# Patient Record
Sex: Female | Born: 1990 | Race: Black or African American | Hispanic: No | Marital: Single | Smoking: Never smoker
Health system: Southern US, Community
[De-identification: ages and names within clinical notes are randomized; demographics above are authoritative.]

## PROBLEM LIST (undated history)

## (undated) DIAGNOSIS — E119 Type 2 diabetes mellitus without complications: Secondary | ICD-10-CM

## (undated) DIAGNOSIS — D649 Anemia, unspecified: Secondary | ICD-10-CM

## (undated) DIAGNOSIS — J45909 Unspecified asthma, uncomplicated: Secondary | ICD-10-CM

## (undated) HISTORY — PX: OTHER SURGICAL HISTORY: SHX169

---
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
## 2011-11-15 ENCOUNTER — Emergency Department (HOSPITAL_COMMUNITY)
Admission: EM | Admit: 2011-11-15 | Discharge: 2011-11-15 | Disposition: A | Discharge status: Home / Self Care | Payer: Self-pay | Attending: Emergency Medicine | Admitting: Emergency Medicine

## 2011-11-15 ENCOUNTER — Encounter (HOSPITAL_COMMUNITY): Payer: Self-pay | Admitting: *Deleted

## 2011-11-15 DIAGNOSIS — R1031 Right lower quadrant pain: Secondary | ICD-10-CM | POA: Insufficient documentation

## 2011-11-15 DIAGNOSIS — R109 Unspecified abdominal pain: Secondary | ICD-10-CM

## 2011-11-15 DIAGNOSIS — R112 Nausea with vomiting, unspecified: Secondary | ICD-10-CM | POA: Insufficient documentation

## 2011-11-15 LAB — URINALYSIS, ROUTINE W REFLEX MICROSCOPIC
Glucose, UA: NEGATIVE mg/dL
Nitrite: NEGATIVE
Specific Gravity, Urine: 1.019 (ref 1.005–1.030)
pH: 8 (ref 5.0–8.0)

## 2011-11-15 LAB — CBC WITH DIFFERENTIAL/PLATELET
Basophils Relative: 0 % (ref 0–1)
HCT: 38.2 % (ref 36.0–46.0)
Hemoglobin: 12.5 g/dL (ref 12.0–15.0)
Lymphocytes Relative: 22 % (ref 12–46)
Lymphs Abs: 2.1 10*3/uL (ref 0.7–4.0)
MCH: 27.1 pg (ref 26.0–34.0)
MCV: 82.7 fL (ref 78.0–100.0)
Platelets: 265 10*3/uL (ref 150–400)
RBC: 4.62 MIL/uL (ref 3.87–5.11)
WBC: 9.5 10*3/uL (ref 4.0–10.5)

## 2011-11-15 LAB — COMPREHENSIVE METABOLIC PANEL
ALT: 8 U/L (ref 0–35)
Albumin: 3.9 g/dL (ref 3.5–5.2)
Alkaline Phosphatase: 55 U/L (ref 39–117)
BUN: 11 mg/dL (ref 6–23)
CO2: 26 mEq/L (ref 19–32)
GFR calc non Af Amer: >90 mL/min (ref >90)
Potassium: 4.1 mEq/L (ref 3.5–5.1)
Sodium: 136 mEq/L (ref 135–145)
Total Protein: 7.5 g/dL (ref 6.0–8.3)

## 2011-11-15 LAB — WET PREP, GENITAL
Clue Cells Wet Prep HPF POC: NONE SEEN
Trich, Wet Prep: NONE SEEN

## 2011-11-15 MED ORDER — ONDANSETRON 4 MG PO TBDP
4.0000 mg | ORAL_TABLET | Freq: Once | ORAL | Status: AC
  Administered 2011-11-15: 4 mg via ORAL
  Filled 2011-11-15: qty 1

## 2011-11-15 MED ORDER — ONDANSETRON HCL 4 MG PO TABS: 4.0000 mg | ORAL_TABLET | Freq: Four times a day (QID) | ORAL | Status: AC | PRN

## 2011-11-15 NOTE — ED Provider Notes (Signed)
History     CSN: [id number]  Arrival date & time 11/15/11  1557   First MD Initiated Contact with Patient 11/15/11 2029      Chief Complaint  Patient presents with  . Abdominal Pain    (Consider location/radiation/quality/duration/timing/severity/associated sxs/prior treatment) Patient is a 21 y.o. female presenting with abdominal pain. The history is provided by the patient.  Abdominal Pain The primary symptoms of the illness include abdominal pain, nausea, vomiting and vaginal discharge. The primary symptoms of the illness do not include fever, fatigue, shortness of breath, diarrhea, dysuria or vaginal bleeding. The current episode started 6 to 12 hours ago. The onset of the illness was sudden. The problem has been rapidly improving.  The abdominal pain is located in the RLQ. The abdominal pain does not radiate. The severity of the abdominal pain is 0/10.  Vomiting occurs 2 to 5 times per day. The emesis contains stomach contents.  The vaginal discharge was first noticed yesterday. Vaginal discharge is a recurrent problem. The amount of discharge is scant. The color of the discharge is clear. The vaginal discharge is not associated with itching, burning or dysuria.   The patient states that she believes she is currently not pregnant. The patient has not had a change in bowel habit. Symptoms associated with the illness do not include chills, constipation, urgency, hematuria, frequency or back pain.    History reviewed. No pertinent past medical history.  History reviewed. No pertinent past surgical history.  No family history on file.  History  Substance Use Topics  . Smoking status: Never Smoker   . Smokeless tobacco: Not on file  . Alcohol Use: No    OB History    Grav Para Term Preterm Abortions TAB SAB Ect Mult Living                  Review of Systems  Constitutional: Negative for fever, chills and fatigue.  Respiratory: Negative for cough and shortness of breath.    Cardiovascular: Negative for chest pain and palpitations.  Gastrointestinal: Positive for nausea, vomiting and abdominal pain. Negative for diarrhea and constipation.  Genitourinary: Positive for vaginal discharge. Negative for dysuria, urgency, frequency, hematuria, vaginal bleeding and menstrual problem.  Musculoskeletal: Negative for back pain.  Skin: Negative for itching and rash.  Neurological: Negative for light-headedness and headaches.  All other systems reviewed and are negative.    Allergies  Review of patient's allergies indicates no known allergies.  Home Medications  No current outpatient prescriptions on file.  BP 112/74  Pulse 73  Temp 98.3 F (36.8 C) (Oral)  Resp 18  SpO2 100%  LMP 10/28/2011  Physical Exam  Nursing note and vitals reviewed. Constitutional: She is oriented to person, place, and time. She appears well-developed and well-nourished.  HENT:  Head: Normocephalic and atraumatic.  Eyes: Pupils are equal, round, and reactive to light.  Cardiovascular: Normal rate, regular rhythm, normal heart sounds and intact distal pulses.   Pulmonary/Chest: Effort normal and breath sounds normal. No respiratory distress.  Abdominal: Soft. Bowel sounds are normal. She exhibits no distension. There is no tenderness.       obese  Genitourinary: Uterus is not tender. Cervix exhibits discharge (scant, clear, mucousy). Cervix exhibits no motion tenderness and no friability. Right adnexum displays no tenderness and no fullness. Left adnexum displays no tenderness and no fullness. No bleeding around the vagina.  Neurological: She is alert and oriented to person, place, and time.  Skin: Skin is warm and  dry.  Psychiatric: She has a normal mood and affect.    ED Course  Procedures (including critical care time)   Labs Reviewed  URINALYSIS, ROUTINE W REFLEX MICROSCOPIC  PREGNANCY, URINE  CBC WITH DIFFERENTIAL  COMPREHENSIVE METABOLIC PANEL  WET PREP, GENITAL    GC/CHLAMYDIA PROBE AMP, GENITAL   No results found.   No diagnosis found.    MDM  Splenic-year-old female who presents today with sudden onset of nausea and vomiting this morning, accompanied by right lower artery pain. She just had several episodes of vomiting, and her nausea significantly improved, though she still has occasional waves of nausea. She has had complete resolution of her pain at this point in time. She denies any change in her bowels, any urinary symptoms, any radiation of her pain or any other pain. She does note a mild amount of clear discharge occasionally when she wipes, which she says she has intermittently. She states she has a past history of a UTI, but her current pain does not feel like the dysuria that she had then, and had a previous STD, but was having more discharge and did not have pain with it. She denies any sick contacts, any bad food exposures. On exam she has no abdominal tenderness, and pelvic exam is unremarkable, she has no adnexal tenderness. Given the patient's normal lab work, and complete resolution of her pain prior to being seen, as well as her nausea after some Zofran, do not feel that she needs imaging at this point in time. Discussed with the patient treatment at home, followup with her regular Dr., and indications worsening symptoms for which she should return. The patient is understanding with this plan.       Theotis Burrow, MD 11/16/11 0040

## 2011-11-15 NOTE — ED Notes (Signed)
rlq pain that started this am with n and v.  lmpjuly 1st

## 2011-11-15 NOTE — ED Provider Notes (Signed)
Complains of right lower quadrant pain onset 1 PM today which has since resolved she is currently asymptomatic and hungry. On exam alert nontoxic abdomen obese soft nontender. Remote possibility of appendicitis discussed with patient. She knows to return or go to another hospital for reevaluation if symptoms recur. Clear liquids for the next 12 hours. Pain felt to be nonspecific presently.  Doug Sou, MD 11/15/11 2206

## 2011-11-16 NOTE — ED Provider Notes (Signed)
I have personally seen and examined the patient.  I have discussed the plan of care with the resident.  I have reviewed the documentation on PMH/FH/Soc. History.  I have reviewed the documentation of the resident and agree.  Doug Sou, MD 11/16/11 0130

## 2014-04-02 ENCOUNTER — Emergency Department (HOSPITAL_COMMUNITY)
Admission: EM | Admit: 2014-04-02 | Discharge: 2014-04-02 | Disposition: A | Discharge status: Home / Self Care | Payer: Medicaid Other | Attending: Emergency Medicine | Admitting: Emergency Medicine

## 2014-04-02 ENCOUNTER — Encounter (HOSPITAL_COMMUNITY): Payer: Self-pay | Admitting: Emergency Medicine

## 2014-04-02 DIAGNOSIS — R112 Nausea with vomiting, unspecified: Secondary | ICD-10-CM

## 2014-04-02 DIAGNOSIS — O2342 Unspecified infection of urinary tract in pregnancy, second trimester: Secondary | ICD-10-CM | POA: Diagnosis not present

## 2014-04-02 DIAGNOSIS — O210 Mild hyperemesis gravidarum: Secondary | ICD-10-CM | POA: Diagnosis present

## 2014-04-02 DIAGNOSIS — Z3A18 18 weeks gestation of pregnancy: Secondary | ICD-10-CM | POA: Insufficient documentation

## 2014-04-02 DIAGNOSIS — N390 Urinary tract infection, site not specified: Secondary | ICD-10-CM

## 2014-04-02 DIAGNOSIS — J45909 Unspecified asthma, uncomplicated: Secondary | ICD-10-CM | POA: Diagnosis not present

## 2014-04-02 HISTORY — DX: Unspecified asthma, uncomplicated: J45.909

## 2014-04-02 LAB — I-STAT CHEM 8, ED
BUN: 7 mg/dL (ref 6–23)
CHLORIDE: 102 meq/L (ref 96–112)
CREATININE: 0.5 mg/dL (ref 0.50–1.10)
Calcium, Ion: 1.17 mmol/L (ref 1.12–1.23)
Glucose, Bld: 112 mg/dL — ABNORMAL HIGH (ref 70–99)
HCT: 33 % — ABNORMAL LOW (ref 36.0–46.0)
Hemoglobin: 11.2 g/dL — ABNORMAL LOW (ref 12.0–15.0)
POTASSIUM: 3.7 meq/L (ref 3.7–5.3)
Sodium: 136 mEq/L — ABNORMAL LOW (ref 137–147)
TCO2: 22 mmol/L (ref 0–100)

## 2014-04-02 LAB — URINE MICROSCOPIC-ADD ON

## 2014-04-02 LAB — URINALYSIS, ROUTINE W REFLEX MICROSCOPIC
Bilirubin Urine: NEGATIVE
GLUCOSE, UA: NEGATIVE mg/dL
Hgb urine dipstick: NEGATIVE
KETONES UR: NEGATIVE mg/dL
Nitrite: NEGATIVE
PH: 7.5 (ref 5.0–8.0)
PROTEIN: NEGATIVE mg/dL
SPECIFIC GRAVITY, URINE: 1.024 (ref 1.005–1.030)
UROBILINOGEN UA: 1 mg/dL (ref 0.0–1.0)

## 2014-04-02 MED ORDER — ONDANSETRON 8 MG PO TBDP: ORAL_TABLET | ORAL | Status: DC

## 2014-04-02 MED ORDER — ONDANSETRON 4 MG PO TBDP
8.0000 mg | ORAL_TABLET | Freq: Once | ORAL | Status: AC
  Administered 2014-04-02: 8 mg via ORAL

## 2014-04-02 MED ORDER — CEPHALEXIN 500 MG PO CAPS: 500.0000 mg | ORAL_CAPSULE | Freq: Four times a day (QID) | ORAL | Status: DC

## 2014-04-02 NOTE — ED Provider Notes (Signed)
CSN: [id number]     Arrival date & time 04/02/14  0229 History  This chart was scribed for Joya Gaskinsonald W Yigit Norkus, MD by Elon SpannerGarrett Cook, ED Scribe. This patient was seen in room A09C/A09C and the patient's care was started at 3:00 AM.   Chief Complaint  Patient presents with  . Emesis During Pregnancy    Patient is a 23 y.o. female presenting with vomiting. The history is provided by the patient. No language interpreter was used.  Emesis Severity:  Mild Duration:  15 hours Timing:  Intermittent Number of daily episodes:  3 total since onset Quality:  Stomach contents Progression:  Unchanged Chronicity:  Recurrent Recent urination:  Normal Relieved by:  Nothing Worsened by:  Nothing tried Ineffective treatments:  None tried Associated symptoms: no abdominal pain, no diarrhea and no fever   Risk factors: pregnant now    HPI Comments: Bethany Bray is a 23 y.o. female who is [redacted] weeks pregnant and due on May 3rd.  She [facility] and takes a prenatal vitamin and asthma medication but no others.  She denies any complications from the pregnancy, but states that she did experience a significant amount of emesis during her first trimester that subsided during her second trimester.  She has an upcoming appointment with her OB/GYN.  She presents to the Emergency Department complaining of nausea and 3 episodes of emesis onset yesterday at noon.  She reports 1 episode had a very small amount of blood content.  She also notes mild abdominal pressure.  She has mild back pain currently that she attributes to her current position on the bed.   Patient denies fever, cough, dysuria, vaginal discharge/bleeding, contractions.  Patient denies CP.    Past Medical History  Diagnosis Date  . Asthma    History reviewed. No pertinent past surgical history. No family history on file. History  Substance Use Topics  . Smoking status: Never Smoker   . Smokeless tobacco: Not  on file  . Alcohol Use: No   OB History    Gravida Para Term Preterm AB TAB SAB Ectopic Multiple Living   1              Review of Systems  Constitutional: Negative for fever.  Respiratory: Negative for cough.   Cardiovascular: Negative for chest pain.  Gastrointestinal: Positive for nausea and vomiting. Negative for abdominal pain and diarrhea.  Genitourinary: Negative for dysuria, vaginal bleeding and vaginal discharge.  All other systems reviewed and are negative.     Allergies  Review of patient's allergies indicates no known allergies.  Home Medications   Prior to Admission medications   Not on File   BP 137/67 mmHg  Pulse 103  Temp(Src) 97.9 F (36.6 C) (Oral)  Resp 18  Ht 5' (1.524 m)  Wt 187 lb (84.823 kg)  BMI 36.52 kg/m2  SpO2 99%  LMP 10/24/2013 (Exact Date) Physical Exam  Nursing note and vitals reviewed.  CONSTITUTIONAL: Well developed/well nourished HEAD: Normocephalic/atraumatic EYES: EOMI/PERRL ENMT: Mucous membranes moist NECK: supple no meningeal signs SPINE/BACK:entire spine nontender CV: S1/S2 noted, no murmurs/rubs/gallops noted LUNGS: Lungs are clear to auscultation bilaterally, no apparent distress ABDOMEN: soft, nontender, no rebound or guarding, bowel sounds noted throughout abdomen GU:no cva tenderness NEURO: Pt is awake/alert/appropriate, moves all extremitiesx4.  No facial droop.   EXTREMITIES: pulses normal/equal, full ROM SKIN: warm, color normal PSYCH: no abnormalities of mood noted, alert and oriented to situation  ED Course  Procedures   DIAGNOSTIC STUDIES: Oxygen Saturation is 99% on RA, normal by my interpretation.    COORDINATION OF CARE:  3:05 AM Discussed treatment plan with patient at bedside.  Patient acknowledges and agrees with plan.    Pt improved She has no focal abdominal tenderness She is taking PO He vitals are appropriate Will treat for UTI  She also requests meds for nausea She has OBGYN followup  early next week [facility] but [facility] reports it has not been documented recently, and she denies abd pain/cramping/bleeding   Labs Review Labs Reviewed  URINALYSIS, ROUTINE W REFLEX MICROSCOPIC - Abnormal; Notable for the following:    APPearance CLOUDY (*)    Leukocytes, UA SMALL (*)    All other components within normal limits  URINE MICROSCOPIC-ADD ON - Abnormal; Notable for the following:    Squamous Epithelial / LPF MANY (*)    Bacteria, UA MANY (*)    All other components within normal limits  I-STAT CHEM 8, ED - Abnormal; Notable for the following:    Sodium 136 (*)    Glucose, Bld 112 (*)    Hemoglobin 11.2 (*)    HCT 33.0 (*)    All other components within normal limits  URINE CULTURE    Medications  ondansetron (ZOFRAN-ODT) disintegrating tablet 8 mg (8 mg Oral Given 04/02/14 0309)    MDM   Final diagnoses:  Non-intractable vomiting with nausea, vomiting of unspecified type  UTI (lower urinary tract infection)    Nursing notes including past medical history and social history reviewed and considered in documentation Labs/vital reviewed myself and considered during evaluation   I personally performed the services described in this documentation, which was scribed in my presence. The recorded information has been reviewed and is accurate.      Joya Gaskinsonald W Mosella Kasa, MD 04/02/14 [phone]

## 2014-04-02 NOTE — ED Notes (Signed)
Pt arrives with c/o nausea/emesis and "pressure" in generalized abdomen. States she's [redacted] weeks pregnant.

## 2014-04-02 NOTE — ED Notes (Signed)
EDP at bedside, unable to hear fetal heart tones with doppler

## 2014-04-03 LAB — URINE CULTURE: Colony Count: >=100000

## 2014-12-12 ENCOUNTER — Emergency Department (HOSPITAL_COMMUNITY)
Admission: EM | Admit: 2014-12-12 | Discharge: 2014-12-12 | Disposition: A | Discharge status: Home / Self Care | Payer: Medicaid Other | Attending: Emergency Medicine | Admitting: Emergency Medicine

## 2014-12-12 ENCOUNTER — Encounter (HOSPITAL_COMMUNITY): Payer: Self-pay

## 2014-12-12 DIAGNOSIS — Z3202 Encounter for pregnancy test, result negative: Secondary | ICD-10-CM | POA: Insufficient documentation

## 2014-12-12 DIAGNOSIS — Z79899 Other long term (current) drug therapy: Secondary | ICD-10-CM | POA: Insufficient documentation

## 2014-12-12 DIAGNOSIS — D649 Anemia, unspecified: Secondary | ICD-10-CM | POA: Insufficient documentation

## 2014-12-12 DIAGNOSIS — R112 Nausea with vomiting, unspecified: Secondary | ICD-10-CM

## 2014-12-12 DIAGNOSIS — J45909 Unspecified asthma, uncomplicated: Secondary | ICD-10-CM | POA: Insufficient documentation

## 2014-12-12 HISTORY — DX: Anemia, unspecified: D64.9

## 2014-12-12 LAB — COMPREHENSIVE METABOLIC PANEL
ALBUMIN: 4.1 g/dL (ref 3.5–5.0)
ALT: 11 U/L — ABNORMAL LOW (ref 14–54)
ANION GAP: 12 (ref 5–15)
AST: 17 U/L (ref 15–41)
Alkaline Phosphatase: 74 U/L (ref 38–126)
BUN: 13 mg/dL (ref 6–20)
CO2: 23 mmol/L (ref 22–32)
Calcium: 9.3 mg/dL (ref 8.9–10.3)
Chloride: 103 mmol/L (ref 101–111)
Creatinine, Ser: 0.74 mg/dL (ref 0.44–1.00)
GFR calc non Af Amer: >60 mL/min (ref >60)
GLUCOSE: 83 mg/dL (ref 65–99)
POTASSIUM: 4 mmol/L (ref 3.5–5.1)
SODIUM: 138 mmol/L (ref 135–145)
Total Bilirubin: 0.7 mg/dL (ref 0.3–1.2)
Total Protein: 7.2 g/dL (ref 6.5–8.1)

## 2014-12-12 LAB — PREGNANCY, URINE: Preg Test, Ur: NEGATIVE

## 2014-12-12 LAB — CBC WITH DIFFERENTIAL/PLATELET
BASOS PCT: 0 % (ref 0–1)
Basophils Absolute: 0 10*3/uL (ref 0.0–0.1)
EOS ABS: 0 10*3/uL (ref 0.0–0.7)
EOS PCT: 0 % (ref 0–5)
HCT: 38.8 % (ref 36.0–46.0)
Hemoglobin: 12.6 g/dL (ref 12.0–15.0)
Lymphocytes Relative: 7 % — ABNORMAL LOW (ref 12–46)
Lymphs Abs: 0.7 10*3/uL (ref 0.7–4.0)
MCH: 25.7 pg — ABNORMAL LOW (ref 26.0–34.0)
MCHC: 32.5 g/dL (ref 30.0–36.0)
MCV: 79.2 fL (ref 78.0–100.0)
MONO ABS: 0.4 10*3/uL (ref 0.1–1.0)
MONOS PCT: 4 % (ref 3–12)
NEUTROS PCT: 89 % — AB (ref 43–77)
Neutro Abs: 8.8 10*3/uL — ABNORMAL HIGH (ref 1.7–7.7)
PLATELETS: 278 10*3/uL (ref 150–400)
RBC: 4.9 MIL/uL (ref 3.87–5.11)
RDW: 13.4 % (ref 11.5–15.5)
WBC: 10 10*3/uL (ref 4.0–10.5)

## 2014-12-12 LAB — URINALYSIS, ROUTINE W REFLEX MICROSCOPIC
Bilirubin Urine: NEGATIVE
GLUCOSE, UA: NEGATIVE mg/dL
Hgb urine dipstick: NEGATIVE
Ketones, ur: NEGATIVE mg/dL
LEUKOCYTES UA: NEGATIVE
NITRITE: NEGATIVE
PROTEIN: NEGATIVE mg/dL
Specific Gravity, Urine: 1.012 (ref 1.005–1.030)
Urobilinogen, UA: 1 mg/dL (ref 0.0–1.0)
pH: 6 (ref 5.0–8.0)

## 2014-12-12 LAB — LIPASE, BLOOD: Lipase: 25 U/L (ref 22–51)

## 2014-12-12 MED ORDER — PROMETHAZINE HCL 25 MG PO TABS: 25.0000 mg | ORAL_TABLET | Freq: Four times a day (QID) | ORAL | Status: DC | PRN

## 2014-12-12 MED ORDER — KETOROLAC TROMETHAMINE 30 MG/ML IJ SOLN
30.0000 mg | Freq: Once | INTRAMUSCULAR | Status: AC
  Administered 2014-12-12: 30 mg via INTRAVENOUS
  Filled 2014-12-12: qty 1

## 2014-12-12 MED ORDER — SODIUM CHLORIDE 0.9 % IV BOLUS (SEPSIS)
1000.0000 mL | Freq: Once | INTRAVENOUS | Status: AC
  Administered 2014-12-12: 1000 mL via INTRAVENOUS

## 2014-12-12 MED ORDER — ONDANSETRON HCL 4 MG/2ML IJ SOLN
4.0000 mg | Freq: Once | INTRAMUSCULAR | Status: AC
  Administered 2014-12-12: 4 mg via INTRAVENOUS
  Filled 2014-12-12: qty 2

## 2014-12-12 NOTE — Discharge Instructions (Signed)
Nausea and Vomiting °Nausea is a sick feeling that often comes before throwing up (vomiting). Vomiting is a reflex where stomach contents come out of your mouth. Vomiting can cause severe loss of body fluids (dehydration). Children and elderly adults can become dehydrated quickly, especially if they also have diarrhea. Nausea and vomiting are symptoms of a condition or disease. It is important to find the cause of your symptoms. °CAUSES  °· Direct irritation of the stomach lining. This irritation can result from increased acid production (gastroesophageal reflux disease), infection, food poisoning, taking certain medicines (such as nonsteroidal anti-inflammatory drugs), alcohol use, or tobacco use. °· Signals from the brain. These signals could be caused by a headache, heat exposure, an inner ear disturbance, increased pressure in the brain from injury, infection, a tumor, or a concussion, pain, emotional stimulus, or metabolic problems. °· An obstruction in the gastrointestinal tract (bowel obstruction). °· Illnesses such as diabetes, hepatitis, gallbladder problems, appendicitis, kidney problems, cancer, sepsis, atypical symptoms of a heart attack, or eating disorders. °· Medical treatments such as chemotherapy and radiation. °· Receiving medicine that makes you sleep (general anesthetic) during surgery. °DIAGNOSIS °Your caregiver may ask for tests to be done if the problems do not improve after a few days. Tests may also be done if symptoms are severe or if the reason for the nausea and vomiting is not clear. Tests may include: °· Urine tests. °· Blood tests. °· Stool tests. °· Cultures (to look for evidence of infection). °· X-rays or other imaging studies. °Test results can help your caregiver make decisions about treatment or the need for additional tests. °TREATMENT °You need to stay well hydrated. Drink frequently but in small amounts. You may wish to drink water, sports drinks, clear broth, or eat frozen  ice pops or gelatin dessert to help stay hydrated. When you eat, eating slowly may help prevent nausea. There are also some antinausea medicines that may help prevent nausea. °HOME CARE INSTRUCTIONS  °· Take all medicine as directed by your caregiver. °· If you do not have an appetite, do not force yourself to eat. However, you must continue to drink fluids. °· If you have an appetite, eat a normal diet unless your caregiver tells you differently. °¨ Eat a variety of complex carbohydrates (rice, wheat, potatoes, bread), lean meats, yogurt, fruits, and vegetables. °¨ Avoid high-fat foods because they are more difficult to digest. °· Drink enough water and fluids to keep your urine clear or pale yellow. °· If you are dehydrated, ask your caregiver for specific rehydration instructions. Signs of dehydration may include: °¨ Severe thirst. °¨ Dry lips and mouth. °¨ Dizziness. °¨ Dark urine. °¨ Decreasing urine frequency and amount. °¨ Confusion. °¨ Rapid breathing or pulse. °SEEK IMMEDIATE MEDICAL CARE IF:  °· You have blood or brown flecks (like coffee grounds) in your vomit. °· You have black or bloody stools. °· You have a severe headache or stiff neck. °· You are confused. °· You have severe abdominal pain. °· You have chest pain or trouble breathing. °· You do not urinate at least once every 8 hours. °· You develop cold or clammy skin. °· You continue to vomit for longer than 24 to 48 hours. °· You have a fever. °MAKE SURE YOU:  °· Understand these instructions. °· Will watch your condition. °· Will get help right away if you are not doing well or get worse. °Document Released: 04/15/2005 Document Revised: 07/08/2011 Document Reviewed: 09/12/2010 °[employer]® Patient Information ©2015 [employer]. This information is not intended   to replace advice given to you by your health care provider. Make sure you discuss any questions you have with your health care provider.   Please obtain all of your results from medical  records or have your doctors office obtain the results - share them with your doctor - you should be seen at your doctors office in the next 2 days. Call today to arrange your follow up. Take the medications as prescribed. Please review all of the medicines and only take them if you do not have an allergy to them. Please be aware that if you are taking birth control pills, taking other prescriptions, ESPECIALLY ANTIBIOTICS may make the birth control ineffective - if this is the case, either do not engage in sexual activity or use alternative methods of birth control such as condoms until you have finished the medicine and your family doctor says it is OK to restart them. If you are on a blood thinner such as COUMADIN, be aware that any other medicine that you take may cause the coumadin to either work too much, or not enough - you should have your coumadin level rechecked in next 7 days if this is the case.  ?  It is also a possibility that you have an allergic reaction to any of the medicines that you have been prescribed - Everybody reacts differently to medications and while MOST people have no trouble with most medicines, you may have a reaction such as nausea, vomiting, rash, swelling, shortness of breath. If this is the case, please stop taking the medicine immediately and contact your physician.  ?  You should return to the ER if you develop severe or worsening symptoms.

## 2014-12-12 NOTE — ED Notes (Signed)
Pt reports she has been nauseous since midnight last night. 2 episodes of vomiting this morning since she got up. Pt also her urine has a very strong odor with back pain.

## 2014-12-12 NOTE — ED Provider Notes (Signed)
CSN: [id number]     Arrival date & time 12/12/14  4098 History   First MD Initiated Contact with Patient 12/12/14 [phone]     Chief Complaint  Patient presents with  . Nausea  . Emesis     (Consider location/radiation/quality/duration/timing/severity/associated sxs/prior Treatment) HPI Comments: 24 year old female with a history of asthma who presents with nausea and vomiting. The patient states that around midnight she began feeling nauseated and she had 2 episodes of vomiting this morning. She states that this morning she noticed that her urine had a strong odor and she has had mild lower back pain. She has mild generalized abdominal discomfort. Normal bowel movement 2 days ago. No diarrhea or problems with constipation. No fevers. No dysuria. No vaginal bleeding or discharge. No new or unusual foods last night. Last menstrual period was at the end of July.  Patient is a 24 y.o. female presenting with vomiting. The history is provided by the patient.  Emesis   Past Medical History  Diagnosis Date  . Asthma   . Anemia    Past Surgical History  Procedure Laterality Date  . Cesarean section     History reviewed. No pertinent family history. Social History  Substance Use Topics  . Smoking status: Never Smoker   . Smokeless tobacco: None  . Alcohol Use: No   OB History    Gravida Para Term Preterm AB TAB SAB Ectopic Multiple Living   1              Review of Systems  Gastrointestinal: Positive for vomiting.    10 Systems reviewed and are negative for acute change except as noted in the HPI.   Allergies  Review of patient's allergies indicates no known allergies.  Home Medications   Prior to Admission medications   Medication Sig Start Date End Date Taking? Authorizing Provider  albuterol (PROAIR HFA) 108 (90 BASE) MCG/ACT inhaler Inhale 1 puff into the lungs every 6 (six) hours as needed for wheezing or shortness of breath.  02/14/14  Yes Historical Provider, MD  IRON  PO Take 1 tablet by mouth daily.    Yes Historical Provider, MD  PRESCRIPTION MEDICATION Take 1 tablet by mouth daily. *Birth control* with one hormone   Yes Historical Provider, MD  cephALEXin (KEFLEX) 500 MG capsule Take 1 capsule (500 mg total) by mouth 4 (four) times daily. Patient not taking: Reported on 12/12/2014 04/02/14   Zadie Rhine, MD  ondansetron [facility] ODT) 8 MG disintegrating tablet 8mg  ODT q4 hours prn nausea Patient not taking: Reported on 12/12/2014 04/02/14   Zadie Rhine, MD   BP 122/70 mmHg  Pulse 83  Temp(Src) 98.3 F (36.8 C) (Oral)  Resp 14  Ht 4\' 10"  (1.473 m)  Wt 178 lb (80.74 kg)  BMI 37.21 kg/m2  SpO2 100%  LMP 11/12/2014 (Exact Date)  Breastfeeding? Unknown Physical Exam  Constitutional: She is oriented to person, place, and time. She appears well-developed and well-nourished. No distress.  HENT:  Head: Normocephalic and atraumatic.  Moist mucous membranes  Eyes: Conjunctivae are normal. Pupils are equal, round, and reactive to light.  Neck: Neck supple.  Cardiovascular: Normal rate, regular rhythm and normal heart sounds.   No murmur heard. Pulmonary/Chest: Effort normal and breath sounds normal.  Abdominal: Soft. Bowel sounds are normal. She exhibits no distension.  Mild suprapubic tenderness to palpation with no rebound or guarding  Musculoskeletal: She exhibits no edema.  Neurological: She is alert and oriented to person, place, and time.  Fluent speech  Skin: Skin is warm and dry.  Psychiatric: She has a normal mood and affect. Judgment normal.  Nursing note and vitals reviewed.   ED Course  Procedures (including critical care time) Labs Review Labs Reviewed  CBC WITH DIFFERENTIAL/PLATELET - Abnormal; Notable for the following:    MCH 25.7 (*)    Neutrophils Relative % 89 (*)    Neutro Abs 8.8 (*)    Lymphocytes Relative 7 (*)    All other components within normal limits  COMPREHENSIVE METABOLIC PANEL - Abnormal; Notable for the  following:    ALT 11 (*)    All other components within normal limits  URINE CULTURE  PREGNANCY, URINE  URINALYSIS, ROUTINE W REFLEX MICROSCOPIC (NOT AT [facility])  LIPASE, BLOOD    Imaging Review No results found. I, Ambrose Finland Little, personally reviewed and evaluated these lab results as part of my medical decision-making.  Medications  ondansetron (ZOFRAN) injection 4 mg (4 mg Intravenous Given 12/12/14 1118)  sodium chloride 0.9 % bolus 1,000 mL (0 mLs Intravenous Stopped 12/12/14 1325)  ketorolac (TORADOL) 30 MG/ML injection 30 mg (30 mg Intravenous Given 12/12/14 1406)    MDM   Final diagnoses:  None   24 year old female who presents with nausea that began last night as well as a few episodes of vomiting and low back pain. Patient well-appearing with normal vital signs at presentation. Mild suprapubic tenderness without right lower quadrant tenderness on exam. Obtained above lab work and gave the patient IV fluid bolus as well as Zofran.  Labs including UA unremarkable. After receiving medications, the patient reported an improvement in her nausea and was able to drink water. No episodes of vomiting in the emergency department. On reexamination, the patient had no lower abdominal tenderness but did have mild generalized tenderness of abdomen in the bilateral upper quadrants and peri- Umbilical area is without rebound, guarding, or peritonitis. The patient's labs are reassuring against pyelonephritis or kidney stone and she denies any vaginal discharge to suggest cervicitis/PID. No lower abdominal tenderness on reexamination thus I feel that appendicitis is unlikely, and normal white count is reassuring. The patient complains of back pain which feels muscular in nature and is worse with certain movements. Gave the patient Toradol and instructed on supportive care at home for muscular back pain. I extensively reviewed return precautions including focal lower abdominal pain that is  worsening, fever, intractable vomiting, or any other new or alarming symptoms. The patient voiced understanding of these return precautions. Provided with prescription for antiemetics at home and patient discharged in satisfactory condition.  Laurence Spates, MD 12/12/14 [phone]

## 2014-12-14 LAB — URINE CULTURE: SPECIAL REQUESTS: NORMAL

## 2014-12-15 ENCOUNTER — Telehealth (HOSPITAL_BASED_OUTPATIENT_CLINIC_OR_DEPARTMENT_OTHER): Payer: Self-pay | Admitting: Emergency Medicine

## 2014-12-15 NOTE — Progress Notes (Signed)
ED Antimicrobial Stewardship Positive Culture Follow Up   Bethany Bray is an 24 y.o. female who presented to [facility] on 12/12/2014 with a chief complaint of  Chief Complaint  Patient presents with  . Nausea  . Emesis    Recent Results (from the past 720 hour(s))  Urine culture     Status: None   Collection Time: 12/12/14 10:53 AM  Result Value Ref Range Status   Specimen Description URINE, RANDOM  Final   Special Requests Normal  Final   Culture >=100,000 COLONIES/mL ESCHERICHIA COLI  Final   Report Status 12/14/2014 FINAL  Final   Organism ID, Bacteria ESCHERICHIA COLI  Final      Susceptibility   Escherichia coli - MIC*    AMPICILLIN 8 SENSITIVE Sensitive     CEFAZOLIN <=4 SENSITIVE Sensitive     CEFTRIAXONE <=1 SENSITIVE Sensitive     CIPROFLOXACIN <=0.25 SENSITIVE Sensitive     GENTAMICIN <=1 SENSITIVE Sensitive     IMIPENEM <=0.25 SENSITIVE Sensitive     NITROFURANTOIN <=16 SENSITIVE Sensitive     TRIMETH/SULFA <=20 SENSITIVE Sensitive     AMPICILLIN/SULBACTAM 4 SENSITIVE Sensitive     PIP/TAZO <=4 SENSITIVE Sensitive     * >=100,000 COLONIES/mL ESCHERICHIA COLI    Patient discharged originally without antimicrobial agent and treatment is now indicated  New antibiotic prescription: Cefazolin 500 mg, 1 tablet by mouth BID for 5 days.  ED Provider: Trixie Dredge, PA-C  Ancil Boozer 12/15/2014, 8:54 AM Infectious Diseases Pharmacist Phone# [phone]

## 2014-12-17 ENCOUNTER — Telehealth (HOSPITAL_BASED_OUTPATIENT_CLINIC_OR_DEPARTMENT_OTHER): Payer: Self-pay | Admitting: Emergency Medicine

## 2014-12-19 ENCOUNTER — Telehealth (HOSPITAL_COMMUNITY): Payer: Self-pay

## 2014-12-19 NOTE — Telephone Encounter (Signed)
Unable to reach by telephone. Letter sent to address on record.  

## 2014-12-28 ENCOUNTER — Telehealth (HOSPITAL_BASED_OUTPATIENT_CLINIC_OR_DEPARTMENT_OTHER): Payer: Self-pay | Admitting: Emergency Medicine

## 2014-12-28 NOTE — Telephone Encounter (Signed)
Letter returned, unable to forward, lost to followup

## 2015-01-05 ENCOUNTER — Telehealth (HOSPITAL_COMMUNITY): Payer: Self-pay

## 2015-01-05 NOTE — Telephone Encounter (Signed)
Unable to contact pt by mail or telephone. Unable to communicate lab results or treatment changes. 

## 2015-02-21 ENCOUNTER — Encounter (HOSPITAL_COMMUNITY): Payer: Self-pay | Admitting: Emergency Medicine

## 2015-02-21 ENCOUNTER — Emergency Department (HOSPITAL_COMMUNITY)
Admission: EM | Admit: 2015-02-21 | Discharge: 2015-02-21 | Disposition: A | Discharge status: Home / Self Care | Payer: Self-pay | Attending: Emergency Medicine | Admitting: Emergency Medicine

## 2015-02-21 ENCOUNTER — Emergency Department (HOSPITAL_COMMUNITY): Payer: Medicaid Other

## 2015-02-21 DIAGNOSIS — J45901 Unspecified asthma with (acute) exacerbation: Secondary | ICD-10-CM | POA: Insufficient documentation

## 2015-02-21 DIAGNOSIS — J069 Acute upper respiratory infection, unspecified: Secondary | ICD-10-CM | POA: Insufficient documentation

## 2015-02-21 DIAGNOSIS — F419 Anxiety disorder, unspecified: Secondary | ICD-10-CM | POA: Insufficient documentation

## 2015-02-21 DIAGNOSIS — R079 Chest pain, unspecified: Secondary | ICD-10-CM | POA: Insufficient documentation

## 2015-02-21 DIAGNOSIS — Z862 Personal history of diseases of the blood and blood-forming organs and certain disorders involving the immune mechanism: Secondary | ICD-10-CM | POA: Insufficient documentation

## 2015-02-21 DIAGNOSIS — Z79899 Other long term (current) drug therapy: Secondary | ICD-10-CM | POA: Insufficient documentation

## 2015-02-21 DIAGNOSIS — M549 Dorsalgia, unspecified: Secondary | ICD-10-CM | POA: Insufficient documentation

## 2015-02-21 MED ORDER — PREDNISONE 20 MG PO TABS
50.0000 mg | ORAL_TABLET | Freq: Once | ORAL | Status: AC
  Administered 2015-02-21: 50 mg via ORAL
  Filled 2015-02-21: qty 3

## 2015-02-21 MED ORDER — IPRATROPIUM-ALBUTEROL 0.5-2.5 (3) MG/3ML IN SOLN
3.0000 mL | Freq: Once | RESPIRATORY_TRACT | Status: AC
  Administered 2015-02-21: 3 mL via RESPIRATORY_TRACT
  Filled 2015-02-21: qty 3

## 2015-02-21 MED ORDER — ALBUTEROL SULFATE HFA 108 (90 BASE) MCG/ACT IN AERS
1.0000 | INHALATION_SPRAY | RESPIRATORY_TRACT | Status: DC | PRN
  Administered 2015-02-21: 1 via RESPIRATORY_TRACT
  Filled 2015-02-21: qty 6.7

## 2015-02-21 MED ORDER — PREDNISONE 50 MG PO TABS: ORAL_TABLET | ORAL | Status: DC

## 2015-02-21 MED ORDER — AEROCHAMBER PLUS FLO-VU SMALL MISC
1.0000 | Freq: Once | Status: AC
  Administered 2015-02-21: 1
  Filled 2015-02-21 (×2): qty 1

## 2015-02-21 NOTE — ED Notes (Signed)
PT returned from scans. Monitored by pulse ox, bp cuff, and 5-lead. 

## 2015-02-21 NOTE — ED Notes (Signed)
Pt is in stable condition upon d/c an ambulates from ED. 

## 2015-02-21 NOTE — ED Provider Notes (Signed)
CSN: [id number]     Arrival date & time 02/21/15  6578 History   First MD Initiated Contact with Patient 02/21/15 1002     Chief Complaint  Patient presents with  . Asthma   HPI  Bethany Bray is a 24 year old female with PMHx of asthma presenting with SOB. Pt states that she has had a productive cough and sore throat for 1-2 weeks. Her cough is productive of yellow sputum without blood. She states that her throat is mostly sore after coughing and feels "dry". Denies painful swallowing, difficulty eating/drinking or neck pain. She states that last evening she became short of breath while resting on the couch. She has had issues with her insurance provider and was unable to fill her rescue inhaler. She attempted to "sleep it off" but was still experiencing SOB when she woke. She feels that she cannot get a big breath in. She denies any exacerbating factors of her SOB. She states that she is having chest and back pain when she attempts to take deep breaths in. She has not had an asthma exacerbation in over a year. Denies fevers, chills, headache, dizziness, lightheadedness, syncope, congestion, rhinorrhea, ear pain, abdominal pain, nausea or vomiting.   Past Medical History  Diagnosis Date  . Asthma   . Anemia    Past Surgical History  Procedure Laterality Date  . Cesarean section     No family history on file. Social History  Substance Use Topics  . Smoking status: Never Smoker   . Smokeless tobacco: None  . Alcohol Use: No   OB History    Gravida Para Term Preterm AB TAB SAB Ectopic Multiple Living   1              Review of Systems  Constitutional: Negative for fever and chills.  HENT: Positive for sore throat. Negative for congestion, ear pain, rhinorrhea, trouble swallowing and voice change.   Eyes: Negative for pain and itching.  Respiratory: Positive for cough, chest tightness and shortness of breath.   Cardiovascular: Positive for chest pain. Negative for palpitations.   Gastrointestinal: Negative for nausea, vomiting and abdominal pain.  Musculoskeletal: Positive for back pain. Negative for neck pain.  Neurological: Negative for dizziness, syncope, light-headedness and headaches.  All other systems reviewed and are negative.     Allergies  Review of patient's allergies indicates no known allergies.  Home Medications   Prior to Admission medications   Medication Sig Start Date End Date Taking? Authorizing Provider  albuterol (PROAIR HFA) 108 (90 BASE) MCG/ACT inhaler Inhale 1 puff into the lungs every 6 (six) hours as needed for wheezing or shortness of breath.  02/14/14  Yes Historical Provider, MD  predniSONE (DELTASONE) 50 MG tablet Take 1 tablet a day for 5 days 02/21/15   Brenna Friesenhahn, PA-C   BP 109/67 mmHg  Pulse 85  Temp(Src) 98.8 F (37.1 C) (Oral)  Resp 21  Ht 5' (1.524 m)  Wt 180 lb (81.647 kg)  BMI 35.15 kg/m2  SpO2 98%  LMP 02/07/2015 Physical Exam  Constitutional: She appears well-developed and well-nourished. No distress.  HENT:  Head: Normocephalic and atraumatic.  Mouth/Throat: Oropharynx is clear and moist. No oropharyngeal exudate.  Eyes: Conjunctivae are normal. Right eye exhibits no discharge. Left eye exhibits no discharge. No scleral icterus.  Neck: Normal range of motion.  Cardiovascular: Normal rate, regular rhythm, normal heart sounds and intact distal pulses.   Pulmonary/Chest: Tachypnea noted. She is in respiratory distress (increased work  of breathing, cannot speak in full sentences). She has no decreased breath sounds. She has no wheezes. She has no rhonchi. She has no rales.  Musculoskeletal: Normal range of motion.  Moves all extremities spontaneously.   Neurological: She is alert. Coordination normal.  Skin: Skin is warm and dry.  Psychiatric: Her behavior is normal. Her mood appears anxious.  Nursing note and vitals reviewed.   ED Course  Procedures (including critical care time) Labs Review Labs  Reviewed - No data to display  Imaging Review Dg Chest 2 View  02/21/2015  CLINICAL DATA:  Asthma attack, chest tightness, shortness of breath. Productive cough for 2 weeks. EXAM: CHEST  2 VIEW COMPARISON:  06/28/2010. FINDINGS: Trachea is midline. Heart size normal. Lungs are clear. No pleural fluid. IMPRESSION: No acute findings. Electronically Signed   By: Leanna BattlesMelinda  Blietz M.D.   On: 02/21/2015 11:06   I have personally reviewed and evaluated these images and lab results as part of my medical decision-making.   EKG Interpretation None      MDM   Final diagnoses:  Asthma exacerbation  URI, acute   10:15 - Pt with PMHx of asthma presenting with SOB x 1 day. Pt also describing URI symptoms x 1 week. Symptoms include productive cough and sore throat. She has not used a rescue inhaler. Pt is tachypneic and tachycardic on presentation. She appears anxious and has increased work of breathing. She is unable to speak in complete sentences. On exam, pt is moving air well through all lung fields and has no wheezes. Will treat with duoneb and 50 mg prednisone then reassess. 11:15 - Pt HR down to 99 and respirations 12. Pt has unlabored breathing and is able to speak in full sentences. Continues to have good air movement without wheezing in all lung fields. Pt states she feels markedly improved but is still a little short of breath. Will give 2nd duoneb. CXR with no acute findings 12:00 - Pt reports continued improvement. Ambulated with pulse ox and O2 remained 98-100%. Lungs remain clear without wheezing. No respiratory distress. Will give pt albuterol inhaler and spacer for home use. D/c'ed with prednisone burst and instruction to follow up with her PCP in the next few days. Return precautions given in discharge paperwork and discussed with pt at bedside. Pt stable for discharge       Bethany HeimlichStevi Monasia Lair, PA-C 02/21/15 1329  Mancel BaleElliott Wentz, MD 02/24/15 [phone]

## 2015-02-21 NOTE — ED Notes (Signed)
Okay'd with charge to move to POD E

## 2015-02-21 NOTE — Discharge Instructions (Signed)
- Use your inhaler with the spacer as needed for wheezing and shortness of breath - Schedule a follow up appointment with your PCP in the next few days - Return to ED with worsening symptoms   Asthma, Adult Asthma is a recurring condition in which the airways tighten and narrow. Asthma can make it difficult to breathe. It can cause coughing, wheezing, and shortness of breath. Asthma episodes, also called asthma attacks, range from minor to life-threatening. Asthma cannot be cured, but medicines and lifestyle changes can help control it. CAUSES Asthma is believed to be caused by inherited (genetic) and environmental factors, but its exact cause is unknown. Asthma may be triggered by allergens, lung infections, or irritants in the air. Asthma triggers are different for each person. Common triggers include:   Animal dander.  Dust mites.  Cockroaches.  Pollen from trees or grass.  Mold.  Smoke.  Air pollutants such as dust, household cleaners, hair sprays, aerosol sprays, paint fumes, strong chemicals, or strong odors.  Cold air, weather changes, and winds (which increase molds and pollens in the air).  Strong emotional expressions such as crying or laughing hard.  Stress.  Certain medicines (such as aspirin) or types of drugs (such as beta-blockers).  Sulfites in foods and drinks. Foods and drinks that may contain sulfites include dried fruit, potato chips, and sparkling grape juice.  Infections or inflammatory conditions such as the flu, a cold, or an inflammation of the nasal membranes (rhinitis).  Gastroesophageal reflux disease (GERD).  Exercise or strenuous activity. SYMPTOMS Symptoms may occur immediately after asthma is triggered or many hours later. Symptoms include:  Wheezing.  Excessive nighttime or early morning coughing.  Frequent or severe coughing with a common cold.  Chest tightness.  Shortness of breath. DIAGNOSIS  The diagnosis of asthma is made by a  review of your medical history and a physical exam. Tests may also be performed. These may include:  Lung function studies. These tests show how much air you breathe in and out.  Allergy tests.  Imaging tests such as X-rays. TREATMENT  Asthma cannot be cured, but it can usually be controlled. Treatment involves identifying and avoiding your asthma triggers. It also involves medicines. There are 2 classes of medicine used for asthma treatment:   Controller medicines. These prevent asthma symptoms from occurring. They are usually taken every day.  Reliever or rescue medicines. These quickly relieve asthma symptoms. They are used as needed and provide short-term relief. Your health care provider will help you create an asthma action plan. An asthma action plan is a written plan for managing and treating your asthma attacks. It includes a list of your asthma triggers and how they may be avoided. It also includes information on when medicines should be taken and when their dosage should be changed. An action plan may also involve the use of a device called a peak flow meter. A peak flow meter measures how well the lungs are working. It helps you monitor your condition. HOME CARE INSTRUCTIONS   Take medicines only as directed by your health care provider. Speak with your health care provider if you have questions about how or when to take the medicines.  Use a peak flow meter as directed by your health care provider. Record and keep track of readings.  Understand and use the action plan to help minimize or stop an asthma attack without needing to seek medical care.  Control your home environment in the following ways to help prevent  asthma attacks:  Do not smoke. Avoid being exposed to secondhand smoke.  Change your heating and air conditioning filter regularly.  Limit your use of fireplaces and wood stoves.  Get rid of pests (such as roaches and mice) and their droppings.  Throw away  plants if you see mold on them.  Clean your floors and dust regularly. Use unscented cleaning products.  Try to have someone else vacuum for you regularly. Stay out of rooms while they are being vacuumed and for a short while afterward. If you vacuum, use a dust mask from a hardware store, a double-layered or microfilter vacuum cleaner bag, or a vacuum cleaner with a HEPA filter.  Replace carpet with wood, tile, or vinyl flooring. Carpet can trap dander and dust.  Use allergy-proof pillows, mattress covers, and box spring covers.  Wash bed sheets and blankets every week in hot water and dry them in a dryer.  Use blankets that are made of polyester or cotton.  Clean bathrooms and kitchens with bleach. If possible, have someone repaint the walls in these rooms with mold-resistant paint. Keep out of the rooms that are being cleaned and painted.  Wash hands frequently. SEEK MEDICAL CARE IF:   You have wheezing, shortness of breath, or a cough even if taking medicine to prevent attacks.  The colored mucus you cough up (sputum) is thicker than usual.  Your sputum changes from clear or white to yellow, green, gray, or bloody.  You have any problems that may be related to the medicines you are taking (such as a rash, itching, swelling, or trouble breathing).  You are using a reliever medicine more than 2-3 times per week.  Your peak flow is still at 50-79% of your personal best after following your action plan for 1 hour.  You have a fever. SEEK IMMEDIATE MEDICAL CARE IF:   You seem to be getting worse and are unresponsive to treatment during an asthma attack.  You are short of breath even at rest.  You get short of breath when doing very little physical activity.  You have difficulty eating, drinking, or talking due to asthma symptoms.  You develop chest pain.  You develop a fast heartbeat.  You have a bluish color to your lips or fingernails.  You are light-headed, dizzy, or  faint.  Your peak flow is less than 50% of your personal best.   This information is not intended to replace advice given to you by your health care provider. Make sure you discuss any questions you have with your health care provider.   Document Released: 04/15/2005 Document Revised: 01/04/2015 Document Reviewed: 11/12/2012 Elsevier Interactive Patient Education 2016 [employer] Inc[employer]  Asthma Attack Prevention While you may not be able to control the fact that you have asthma, you can take actions to prevent asthma attacks. The best way to prevent asthma attacks is to maintain good control of your asthma. You can achieve this by:  Taking your medicines as directed.  Avoiding things that can irritate your airways or make your asthma symptoms worse (asthma triggers).  Keeping track of how well your asthma is controlled and of any changes in your symptoms.  Responding quickly to worsening asthma symptoms (asthma attack).  Seeking emergency care when it is needed. WHAT ARE SOME WAYS TO PREVENT AN ASTHMA ATTACK? Have a Plan Work with your health care provider to create a written plan for managing and treating your asthma attacks (asthma action plan). This plan includes:  A list  of your asthma triggers and how you can avoid them.  Information on when medicines should be taken and when their dosages should be changed.  The use of a device that measures how well your lungs are working (peak flow meter). Monitor Your Asthma Use your peak flow meter and record your results in a journal every day. A drop in your peak flow numbers on one or more days may indicate the start of an asthma attack. This can happen even before you start to feel symptoms. You can prevent an asthma attack from getting worse by following the steps in your asthma action plan. Avoid Asthma Triggers Work with your asthma health care provider to find out what your asthma triggers are. This can be done by:  Allergy  testing.  Keeping a journal that notes when asthma attacks occur and the factors that may have contributed to them.  Determining if there are other medical conditions that are making your asthma worse. Once you have determined your asthma triggers, take steps to avoid them. This may include avoiding excessive or prolonged exposure to:  Dust. Have someone dust and vacuum your home for you once or twice a week. Using a high-efficiency particulate arrestance (HEPA) vacuum is best.  Smoke. This includes campfire smoke, forest fire smoke, and secondhand smoke from tobacco products.  Pet dander. Avoid contact with animals that you know you are allergic to.  Allergens from trees, grasses or pollens. Avoid spending a lot of time outdoors when pollen counts are high, and on very windy days.  Very cold, dry, or humid air.  Mold.  Foods that contain high amounts of sulfites.  Strong odors.  Outdoor air pollutants, such as Museum/gallery exhibitions officer.  Indoor air pollutants, such as aerosol sprays and fumes from household cleaners.  Household pests, including dust mites and cockroaches, and pest droppings.  Certain medicines, including NSAIDs. Always talk to your health care provider before stopping or starting any new medicines. Medicines Take over-the-counter and prescription medicines only as told by your health care provider. Many asthma attacks can be prevented by carefully following your medicine schedule. Taking your medicines correctly is especially important when you cannot avoid certain asthma triggers. Act Quickly If an asthma attack does happen, acting quickly can decrease how severe it is and how long it lasts. Take these steps:   Pay attention to your symptoms. If you are coughing, wheezing, or having difficulty breathing, do not wait to see if your symptoms go away on their own. Follow your asthma action plan.  If you have followed your asthma action plan and your symptoms are not  improving, call your health care provider or seek immediate medical care at the nearest hospital. It is important to note how often you need to use your fast-acting rescue inhaler. If you are using your rescue inhaler more often, it may mean that your asthma is not under control. Adjusting your asthma treatment plan may help you to prevent future asthma attacks and help you to gain better control of your condition. HOW CAN I PREVENT AN ASTHMA ATTACK WHEN I EXERCISE? Follow advice from your health care provider about whether you should use your fast-acting inhaler before exercising. Many people with asthma experience exercise-induced bronchoconstriction (EIB). This condition often worsens during vigorous exercise in cold, humid, or dry environments. Usually, people with EIB can stay very active by pre-treating with a fast-acting inhaler before exercising.   This information is not intended to replace advice given to you by  your health care provider. Make sure you discuss any questions you have with your health care provider.   Document Released: 04/03/2009 Document Revised: 01/04/2015 Document Reviewed: 09/15/2014 Elsevier Interactive Patient Education [employer]

## 2015-02-21 NOTE — ED Notes (Signed)
Patient states URI for several days.   Patient states history of asthma, but very hard to breathe at this time.   Patient cannot speak in complete sentences.

## 2015-02-21 NOTE — ED Notes (Signed)
Patient transported to X-ray 

## 2015-02-21 NOTE — ED Notes (Addendum)
Pt ambulates well and SpO2 remains between 98% and 100% on RA.

## 2015-07-01 ENCOUNTER — Emergency Department (HOSPITAL_COMMUNITY)
Admission: EM | Admit: 2015-07-01 | Discharge: 2015-07-01 | Disposition: A | Discharge status: Home / Self Care | Payer: Medicaid Other | Attending: Emergency Medicine | Admitting: Emergency Medicine

## 2015-07-01 ENCOUNTER — Encounter (HOSPITAL_COMMUNITY): Payer: Self-pay | Admitting: Family Medicine

## 2015-07-01 DIAGNOSIS — J45909 Unspecified asthma, uncomplicated: Secondary | ICD-10-CM | POA: Insufficient documentation

## 2015-07-01 DIAGNOSIS — O99511 Diseases of the respiratory system complicating pregnancy, first trimester: Secondary | ICD-10-CM | POA: Insufficient documentation

## 2015-07-01 DIAGNOSIS — O210 Mild hyperemesis gravidarum: Secondary | ICD-10-CM | POA: Insufficient documentation

## 2015-07-01 DIAGNOSIS — Z3A01 Less than 8 weeks gestation of pregnancy: Secondary | ICD-10-CM | POA: Insufficient documentation

## 2015-07-01 LAB — CBC
HCT: 38 % (ref 36.0–46.0)
Hemoglobin: 12 g/dL (ref 12.0–15.0)
MCH: 25.3 pg — AB (ref 26.0–34.0)
MCHC: 31.6 g/dL (ref 30.0–36.0)
MCV: 80 fL (ref 78.0–100.0)
PLATELETS: 277 10*3/uL (ref 150–400)
RBC: 4.75 MIL/uL (ref 3.87–5.11)
RDW: 13.6 % (ref 11.5–15.5)
WBC: 12.7 10*3/uL — ABNORMAL HIGH (ref 4.0–10.5)

## 2015-07-01 LAB — COMPREHENSIVE METABOLIC PANEL
ALBUMIN: 3.8 g/dL (ref 3.5–5.0)
ALK PHOS: 50 U/L (ref 38–126)
ALT: 12 U/L — AB (ref 14–54)
AST: 15 U/L (ref 15–41)
Anion gap: 7 (ref 5–15)
BUN: 11 mg/dL (ref 6–20)
CALCIUM: 9.3 mg/dL (ref 8.9–10.3)
CHLORIDE: 105 mmol/L (ref 101–111)
CO2: 25 mmol/L (ref 22–32)
CREATININE: 0.6 mg/dL (ref 0.44–1.00)
GFR calc Af Amer: >60 mL/min (ref >60)
GFR calc non Af Amer: >60 mL/min (ref >60)
GLUCOSE: 82 mg/dL (ref 65–99)
Potassium: 4.6 mmol/L (ref 3.5–5.1)
SODIUM: 137 mmol/L (ref 135–145)
Total Bilirubin: 0.4 mg/dL (ref 0.3–1.2)
Total Protein: 7.7 g/dL (ref 6.5–8.1)

## 2015-07-01 LAB — LIPASE, BLOOD: LIPASE: 23 U/L (ref 11–51)

## 2015-07-01 LAB — HCG, QUANTITATIVE, PREGNANCY: hCG, Beta Chain, Quant, S: 135782 m[IU]/mL — ABNORMAL HIGH (ref <5)

## 2015-07-01 MED ORDER — ONDANSETRON 4 MG PO TBDP
4.0000 mg | ORAL_TABLET | Freq: Once | ORAL | Status: DC | PRN
Start: 2015-07-01 — End: 2015-07-01

## 2015-07-01 NOTE — ED Notes (Signed)
Pt here for N,V since yesterday and unable to hold anything down. Pt sts she is [redacted] weeks pregnant.

## 2015-07-01 NOTE — ED Notes (Signed)
Called pt x3, no answer.  

## 2015-07-07 ENCOUNTER — Encounter (HOSPITAL_COMMUNITY): Payer: Self-pay | Admitting: Emergency Medicine

## 2015-07-07 ENCOUNTER — Emergency Department (HOSPITAL_COMMUNITY)
Admission: EM | Admit: 2015-07-07 | Discharge: 2015-07-07 | Disposition: A | Discharge status: Home / Self Care | Payer: Medicaid Other | Attending: Emergency Medicine | Admitting: Emergency Medicine

## 2015-07-07 DIAGNOSIS — O210 Mild hyperemesis gravidarum: Secondary | ICD-10-CM | POA: Insufficient documentation

## 2015-07-07 DIAGNOSIS — J45909 Unspecified asthma, uncomplicated: Secondary | ICD-10-CM | POA: Insufficient documentation

## 2015-07-07 DIAGNOSIS — Z3A01 Less than 8 weeks gestation of pregnancy: Secondary | ICD-10-CM | POA: Insufficient documentation

## 2015-07-07 NOTE — ED Notes (Signed)
Pt. reports nausea and emesis this morning , states [redacted] weeks pregnant with no prenatal visit , no emesis at arrival , pt. added she will have an abortion tomorrow . No fever,diarrhea  or chills.

## 2015-07-07 NOTE — ED Notes (Signed)
Pt decided that she didn't want to wait any longer and was going to go home.

## 2015-07-12 ENCOUNTER — Encounter (HOSPITAL_COMMUNITY): Payer: Self-pay | Admitting: *Deleted

## 2015-07-12 ENCOUNTER — Emergency Department (HOSPITAL_COMMUNITY)
Admission: EM | Admit: 2015-07-12 | Discharge: 2015-07-12 | Disposition: A | Discharge status: Home / Self Care | Payer: Self-pay | Source: Home / Self Care | Attending: Family Medicine | Admitting: Family Medicine

## 2015-07-12 DIAGNOSIS — M545 Low back pain: Secondary | ICD-10-CM

## 2015-07-12 MED ORDER — METHOCARBAMOL 500 MG PO TABS: 500.0000 mg | ORAL_TABLET | Freq: Three times a day (TID) | ORAL | Status: DC

## 2015-07-12 MED ORDER — DICLOFENAC POTASSIUM 50 MG PO TABS: 50.0000 mg | ORAL_TABLET | Freq: Three times a day (TID) | ORAL | Status: DC
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
## 2015-07-19 ENCOUNTER — Encounter (HOSPITAL_COMMUNITY): Payer: Self-pay | Admitting: Emergency Medicine

## 2015-07-19 ENCOUNTER — Emergency Department (HOSPITAL_COMMUNITY)
Admission: EM | Admit: 2015-07-19 | Discharge: 2015-07-19 | Disposition: A | Discharge status: Home / Self Care | Payer: Medicaid Other | Attending: Emergency Medicine | Admitting: Emergency Medicine

## 2015-07-19 DIAGNOSIS — J45901 Unspecified asthma with (acute) exacerbation: Secondary | ICD-10-CM | POA: Insufficient documentation

## 2015-07-19 MED ORDER — ALBUTEROL SULFATE (2.5 MG/3ML) 0.083% IN NEBU
5.0000 mg | INHALATION_SOLUTION | Freq: Once | RESPIRATORY_TRACT | Status: AC
  Administered 2015-07-19: 5 mg via RESPIRATORY_TRACT

## 2015-07-19 MED ORDER — ALBUTEROL SULFATE (2.5 MG/3ML) 0.083% IN NEBU
INHALATION_SOLUTION | RESPIRATORY_TRACT | Status: AC
  Filled 2015-07-19: qty 6

## 2015-07-19 MED ORDER — ACETAMINOPHEN 325 MG PO TABS
ORAL_TABLET | ORAL | Status: AC
  Filled 2015-07-19: qty 2

## 2015-07-19 MED ORDER — ACETAMINOPHEN 325 MG PO TABS
650.0000 mg | ORAL_TABLET | Freq: Once | ORAL | Status: AC
  Administered 2015-07-19: 650 mg via ORAL

## 2015-07-19 NOTE — ED Notes (Signed)
Pt. reports asthma attack with wheezing , cough and chest congestion this morning .

## 2015-07-19 NOTE — ED Notes (Signed)
Pt stated that she felt a little better but didn't want to wait and was going to go to [facility] and get some medicine.

## 2017-10-24 ENCOUNTER — Encounter: Payer: Self-pay | Admitting: Emergency Medicine

## 2017-10-24 ENCOUNTER — Emergency Department
Admission: EM | Admit: 2017-10-24 | Discharge: 2017-10-24 | Disposition: A | Discharge status: Home / Self Care | Payer: Self-pay | Attending: Emergency Medicine | Admitting: Emergency Medicine

## 2017-10-24 DIAGNOSIS — J45909 Unspecified asthma, uncomplicated: Secondary | ICD-10-CM | POA: Insufficient documentation

## 2017-10-24 DIAGNOSIS — H10213 Acute toxic conjunctivitis, bilateral: Secondary | ICD-10-CM | POA: Insufficient documentation

## 2017-10-24 DIAGNOSIS — T542X1A Toxic effect of corrosive acids and acid-like substances, accidental (unintentional), initial encounter: Secondary | ICD-10-CM | POA: Insufficient documentation

## 2017-10-24 DIAGNOSIS — Z79899 Other long term (current) drug therapy: Secondary | ICD-10-CM | POA: Insufficient documentation

## 2017-10-24 MED ORDER — POLYVINYL ALCOHOL 1.4 % OP SOLN
2.0000 [drp] | OPHTHALMIC | Status: DC | PRN
  Administered 2017-10-24: 2 [drp] via OPHTHALMIC
  Filled 2017-10-24: qty 15

## 2017-10-24 MED ORDER — TETRACAINE HCL 0.5 % OP SOLN
OPHTHALMIC | Status: AC
  Administered 2017-10-24: 08:00:00
  Filled 2017-10-24: qty 4

## 2017-10-24 MED ORDER — ERYTHROMYCIN 5 MG/GM OP OINT
TOPICAL_OINTMENT | Freq: Once | OPHTHALMIC | Status: AC
  Administered 2017-10-24: 08:00:00 via OPHTHALMIC

## 2017-10-24 MED ORDER — TETRACAINE HCL 0.5 % OP SOLN
2.0000 [drp] | Freq: Once | OPHTHALMIC | Status: AC
  Administered 2017-10-24: 2 [drp] via OPHTHALMIC

## 2017-10-24 MED ORDER — TETRACAINE HCL 0.5 % OP SOLN
OPHTHALMIC | Status: AC
  Administered 2017-10-24: 2 [drp] via OPHTHALMIC
  Filled 2017-10-24: qty 4

## 2017-10-24 MED ORDER — ERYTHROMYCIN 5 MG/GM OP OINT
TOPICAL_OINTMENT | OPHTHALMIC | Status: AC
  Filled 2017-10-24: qty 1

## 2017-10-24 NOTE — ED Notes (Signed)
Pt's visual acuity difficult to obtain. Pt states "I can only see the E" while holding eye lids open. Pt then closes eyes and states "I can't see anything." Pt asked to state what that means and pt states "It's blurry, it hurts to strain my eyes." EDP notified.

## 2017-10-24 NOTE — ED Notes (Signed)
Pt completed eye wash station after 15 mins, tolerated well. Pt states intermittent burning to both eyes during washing however slight improvement to eye sclera. Pt taken for visual acuity then brought back to room.
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
## 2017-10-24 NOTE — ED Notes (Signed)
Pt taken to eye wash station with this RN

## 2017-10-24 NOTE — ED Notes (Signed)
Pt is stating she cannot see and her vision is still blurry. This RN made EDP aware.

## 2017-10-24 NOTE — ED Notes (Signed)
Pt sts, "I cant open eyes." Pt refusing to open eyes and when asked if she can see for visual acuity, pt sts "Its just blurry."

## 2017-10-24 NOTE — ED Notes (Signed)
Pt's eyes pH tested - left and right reading 7.0 per EDP.

## 2018-01-28 ENCOUNTER — Emergency Department
Admission: EM | Admit: 2018-01-28 | Discharge: 2018-01-28 | Disposition: A | Discharge status: Home / Self Care | Payer: Self-pay | Attending: Emergency Medicine | Admitting: Emergency Medicine

## 2018-01-28 ENCOUNTER — Other Ambulatory Visit: Payer: Self-pay

## 2018-01-28 ENCOUNTER — Encounter: Payer: Self-pay | Admitting: Emergency Medicine

## 2018-01-28 DIAGNOSIS — K529 Noninfective gastroenteritis and colitis, unspecified: Secondary | ICD-10-CM | POA: Insufficient documentation

## 2018-01-28 DIAGNOSIS — J45909 Unspecified asthma, uncomplicated: Secondary | ICD-10-CM | POA: Insufficient documentation

## 2018-01-28 LAB — LIPASE, BLOOD: LIPASE: 32 U/L (ref 11–51)

## 2018-01-28 LAB — COMPREHENSIVE METABOLIC PANEL
ALT: 12 U/L (ref 0–44)
AST: 16 U/L (ref 15–41)
Albumin: 4.3 g/dL (ref 3.5–5.0)
Alkaline Phosphatase: 56 U/L (ref 38–126)
Anion gap: 8 (ref 5–15)
BUN: 18 mg/dL (ref 6–20)
CHLORIDE: 108 mmol/L (ref 98–111)
CO2: 21 mmol/L — AB (ref 22–32)
CREATININE: 0.77 mg/dL (ref 0.44–1.00)
Calcium: 9.1 mg/dL (ref 8.9–10.3)
GFR calc non Af Amer: >60 mL/min (ref >60)
Glucose, Bld: 108 mg/dL — ABNORMAL HIGH (ref 70–99)
Potassium: 4.3 mmol/L (ref 3.5–5.1)
SODIUM: 137 mmol/L (ref 135–145)
Total Bilirubin: 0.6 mg/dL (ref 0.3–1.2)
Total Protein: 7.7 g/dL (ref 6.5–8.1)

## 2018-01-28 LAB — URINALYSIS, COMPLETE (UACMP) WITH MICROSCOPIC
BACTERIA UA: NONE SEEN
BILIRUBIN URINE: NEGATIVE
Glucose, UA: NEGATIVE mg/dL
HGB URINE DIPSTICK: NEGATIVE
KETONES UR: NEGATIVE mg/dL
LEUKOCYTES UA: NEGATIVE
NITRITE: NEGATIVE
PROTEIN: NEGATIVE mg/dL
SPECIFIC GRAVITY, URINE: 1.025 (ref 1.005–1.030)
pH: 5 (ref 5.0–8.0)

## 2018-01-28 LAB — CBC
HCT: 41.2 % (ref 35.0–47.0)
Hemoglobin: 14 g/dL (ref 12.0–16.0)
MCH: 28.1 pg (ref 26.0–34.0)
MCHC: 34 g/dL (ref 32.0–36.0)
MCV: 82.5 fL (ref 80.0–100.0)
PLATELETS: 293 10*3/uL (ref 150–440)
RBC: 5 MIL/uL (ref 3.80–5.20)
RDW: 13.6 % (ref 11.5–14.5)
WBC: 14.3 10*3/uL — AB (ref 3.6–11.0)

## 2018-01-28 LAB — POCT PREGNANCY, URINE: Preg Test, Ur: NEGATIVE

## 2018-01-28 MED ORDER — KETOROLAC TROMETHAMINE 30 MG/ML IJ SOLN
30.0000 mg | Freq: Once | INTRAMUSCULAR | Status: AC
  Administered 2018-01-28: 30 mg via INTRAVENOUS
  Filled 2018-01-28: qty 1

## 2018-01-28 MED ORDER — SODIUM CHLORIDE 0.9 % IV SOLN
Freq: Once | INTRAVENOUS | Status: AC
  Administered 2018-01-28: 10:00:00 via INTRAVENOUS

## 2018-01-28 MED ORDER — DICYCLOMINE HCL 20 MG PO TABS: 20.0000 mg | ORAL_TABLET | Freq: Three times a day (TID) | ORAL | 0 refills | Status: DC | PRN

## 2018-01-28 MED ORDER — IPRATROPIUM-ALBUTEROL 0.5-2.5 (3) MG/3ML IN SOLN
3.0000 mL | Freq: Once | RESPIRATORY_TRACT | Status: AC
  Administered 2018-01-28: 3 mL via RESPIRATORY_TRACT
  Filled 2018-01-28: qty 3

## 2018-01-28 MED ORDER — ONDANSETRON HCL 4 MG/2ML IJ SOLN
4.0000 mg | Freq: Once | INTRAMUSCULAR | Status: AC
  Administered 2018-01-28: 4 mg via INTRAVENOUS
  Filled 2018-01-28: qty 2

## 2018-01-28 MED ORDER — LOPERAMIDE HCL 2 MG PO CAPS
4.0000 mg | ORAL_CAPSULE | Freq: Once | ORAL | Status: AC
  Administered 2018-01-28: 4 mg via ORAL
  Filled 2018-01-28: qty 2

## 2018-01-28 MED ORDER — ONDANSETRON 4 MG PO TBDP: 4.0000 mg | ORAL_TABLET | Freq: Three times a day (TID) | ORAL | 0 refills | Status: DC | PRN

## 2018-01-28 NOTE — ED Notes (Signed)
First Nurse Note: Lab called to request re-draw on lavender tube due to clotting.  [employer] notified.

## 2018-01-28 NOTE — ED Notes (Signed)
First Nurse Note: Lavender topped tube redrawn by [employer].

## 2018-01-28 NOTE — ED Notes (Signed)
First Nurse Note: Patient complaining of vomiting and diarrhea since 0400.  Given face mask to wear.

## 2018-01-28 NOTE — ED Notes (Signed)
Pt called for recollect of lavender tube, pt did not answer.

## 2018-01-28 NOTE — ED Triage Notes (Addendum)
Pt c/o N/V/D and abd pain 9/10. Pt states she vomited 4X since 3am.this morning; pt c/o lower abd pain.pt denies taking any medications to relief abd pain.

## 2018-01-28 NOTE — ED Notes (Signed)
Pt alert and oriented X4, active, cooperative, pt in NAD. RR even and unlabored, color WNL.  Pt informed to return if any life threatening symptoms occur.  Discharge and followup instructions reviewed.  

## 2018-01-28 NOTE — ED Provider Notes (Signed)
[facility] Emergency Department Provider Note       Time seen: ----------------------------------------- 9:25 AM on 01/28/2018 -----------------------------------------   I have reviewed the triage vital signs and the nursing notes.  HISTORY   Chief Complaint Abdominal Pain    HPI [place] Nolton is a 27 y.o. female with a history of anemia and asthma who presents to the ED for nausea, vomiting diarrhea and abdominal pain that is 9 out of 10.  Patient reports she vomited 4 times since 3 AM this morning.  She is having lower abdominal pain.  Nothing makes her symptoms better at this time.  Past Medical History:  Diagnosis Date  . Anemia   . Asthma     There are no active problems to display for this patient.   Past Surgical History:  Procedure Laterality Date  . CESAREAN SECTION      Allergies Patient has no known allergies.  Social History Social History   Tobacco Use  . Smoking status: Never Smoker  . Smokeless tobacco: Never Used  Substance Use Topics  . Alcohol use: No  . Drug use: No   Review of Systems Constitutional: Negative for fever. Cardiovascular: Negative for chest pain. Respiratory: Negative for shortness of breath. Gastrointestinal: Positive for abdominal pain, vomiting and diarrhea Genitourinary: Negative for dysuria. Musculoskeletal: Negative for back pain. Skin: Negative for rash. Neurological: Negative for headaches, focal weakness or numbness.  All systems negative/normal/unremarkable except as stated in the HPI  ____________________________________________   PHYSICAL EXAM:  VITAL SIGNS: ED Triage Vitals  Enc Vitals Group     BP 01/28/18 0754 (!) 108/56     Pulse Rate 01/28/18 0754 97     Resp 01/28/18 0754 20     Temp 01/28/18 0754 98.3 F (36.8 C)     Temp Source 01/28/18 0754 Oral     SpO2 01/28/18 0754 97 %     Weight 01/28/18 0755 195 lb (88.5 kg)     Height 01/28/18 0755 5' (1.524 m)   Head Circumference --      Peak Flow --      Pain Score 01/28/18 0754 9     Pain Loc --      Pain Edu? --      Excl. in GC? --    Constitutional: Alert and oriented. Well appearing and in no distress. Eyes: Conjunctivae are normal. Normal extraocular movements. ENT   Head: Normocephalic and atraumatic.   Nose: No congestion/rhinnorhea.   Mouth/Throat: Mucous membranes are moist.   Neck: No stridor. Cardiovascular: Normal rate, regular rhythm. No murmurs, rubs, or gallops. Respiratory: Normal respiratory effort without tachypnea nor retractions. Breath sounds are clear and equal bilaterally. No wheezes/rales/rhonchi. Gastrointestinal: Soft and nontender. Normal bowel sounds Musculoskeletal: Nontender with normal range of motion in extremities. No lower extremity tenderness nor edema. Neurologic:  Normal speech and language. No gross focal neurologic deficits are appreciated.  Skin:  Skin is warm, dry and intact. No rash noted. Psychiatric: Mood and affect are normal. Speech and behavior are normal.  ____________________________________________  ED COURSE:  As part of my medical decision making, I reviewed the following data within the electronic MEDICAL RECORD NUMBER History obtained from family if available, nursing notes, old chart and ekg, as well as notes from prior ED visits. Patient presented for symptoms of gastroenteritis, we will assess with labs as indicated at this time.   Procedures ____________________________________________   LABS (pertinent positives/negatives)  Labs Reviewed  COMPREHENSIVE METABOLIC PANEL - Abnormal; Notable  for the following components:      Result Value   CO2 21 (*)    Glucose, Bld 108 (*)    All other components within normal limits  URINALYSIS, COMPLETE (UACMP) WITH MICROSCOPIC - Abnormal; Notable for the following components:   Color, Urine YELLOW (*)    APPearance HAZY (*)    All other components within normal limits  CBC -  Abnormal; Notable for the following components:   WBC 14.3 (*)    All other components within normal limits  LIPASE, BLOOD  POC URINE PREG, ED  POCT PREGNANCY, URINE  ____________________________________________  DIFFERENTIAL DIAGNOSIS   Dehydration, electrolyte abnormality, gastroenteritis, pregnancy  FINAL ASSESSMENT AND PLAN  Gastroenteritis   Plan: The patient had presented for vomiting and diarrhea. Patient's labs revealed mild leukocytosis but otherwise no acute process.  She is feeling better with fluids and Zofran.  She will be discharged home with similar.Ulice Dash, MD   Note: This note was generated in part or whole with voice recognition software. Voice recognition is usually quite accurate but there are transcription errors that can and very often do occur. I apologize for any typographical errors that were not detected and corrected.     Emily Filbert, MD 01/28/18 1049

## 2018-01-28 NOTE — ED Notes (Signed)
Per Dr Mayford Knife pt noted to be wheezing and needs breathing treatment prior to discharge

## 2018-01-28 NOTE — ED Notes (Signed)
First Nurse Note: Patient ambulatory to Rm 7, Rosey Bath RN aware of room placement.

## 2018-01-29 ENCOUNTER — Encounter: Payer: Self-pay | Admitting: Emergency Medicine

## 2018-01-29 ENCOUNTER — Emergency Department
Admission: EM | Admit: 2018-01-29 | Discharge: 2018-01-29 | Disposition: A | Discharge status: Home / Self Care | Payer: Self-pay | Attending: Emergency Medicine | Admitting: Emergency Medicine

## 2018-01-29 ENCOUNTER — Emergency Department: Payer: Self-pay

## 2018-01-29 DIAGNOSIS — J45909 Unspecified asthma, uncomplicated: Secondary | ICD-10-CM | POA: Insufficient documentation

## 2018-01-29 DIAGNOSIS — J189 Pneumonia, unspecified organism: Secondary | ICD-10-CM | POA: Insufficient documentation

## 2018-01-29 DIAGNOSIS — Z79899 Other long term (current) drug therapy: Secondary | ICD-10-CM | POA: Insufficient documentation

## 2018-01-29 DIAGNOSIS — J181 Lobar pneumonia, unspecified organism: Secondary | ICD-10-CM

## 2018-01-29 LAB — CBC WITH DIFFERENTIAL/PLATELET
BASOS ABS: 0 10*3/uL (ref 0–0.1)
BASOS PCT: 0 %
EOS ABS: 0.2 10*3/uL (ref 0–0.7)
Eosinophils Relative: 2 %
HCT: 35 % (ref 35.0–47.0)
Hemoglobin: 12.2 g/dL (ref 12.0–16.0)
Lymphocytes Relative: 9 %
Lymphs Abs: 0.9 10*3/uL — ABNORMAL LOW (ref 1.0–3.6)
MCH: 28.6 pg (ref 26.0–34.0)
MCHC: 34.8 g/dL (ref 32.0–36.0)
MCV: 82.1 fL (ref 80.0–100.0)
MONOS PCT: 6 %
Monocytes Absolute: 0.6 10*3/uL (ref 0.2–0.9)
NEUTROS ABS: 8.9 10*3/uL — AB (ref 1.4–6.5)
Neutrophils Relative %: 83 %
Platelets: 244 10*3/uL (ref 150–440)
RBC: 4.27 MIL/uL (ref 3.80–5.20)
RDW: 13.5 % (ref 11.5–14.5)
WBC: 10.7 10*3/uL (ref 3.6–11.0)

## 2018-01-29 MED ORDER — ALBUTEROL SULFATE HFA 108 (90 BASE) MCG/ACT IN AERS: 2.0000 | INHALATION_SPRAY | Freq: Four times a day (QID) | RESPIRATORY_TRACT | 2 refills | Status: DC | PRN

## 2018-01-29 MED ORDER — SODIUM CHLORIDE 0.9 % IV BOLUS
1000.0000 mL | Freq: Once | INTRAVENOUS | Status: AC
  Administered 2018-01-29: 1000 mL via INTRAVENOUS

## 2018-01-29 MED ORDER — IPRATROPIUM-ALBUTEROL 0.5-2.5 (3) MG/3ML IN SOLN
3.0000 mL | Freq: Once | RESPIRATORY_TRACT | Status: AC
  Administered 2018-01-29: 3 mL via RESPIRATORY_TRACT
  Filled 2018-01-29: qty 3

## 2018-01-29 MED ORDER — METHYLPREDNISOLONE SODIUM SUCC 125 MG IJ SOLR
125.0000 mg | Freq: Once | INTRAMUSCULAR | Status: AC
  Administered 2018-01-29: 125 mg via INTRAVENOUS
  Filled 2018-01-29: qty 2

## 2018-01-29 MED ORDER — AZITHROMYCIN 250 MG PO TABS: ORAL_TABLET | ORAL | 0 refills | Status: DC

## 2018-01-29 MED ORDER — ALBUTEROL SULFATE (2.5 MG/3ML) 0.083% IN NEBU
5.0000 mg | INHALATION_SOLUTION | Freq: Once | RESPIRATORY_TRACT | Status: AC
  Administered 2018-01-29: 5 mg via RESPIRATORY_TRACT
  Filled 2018-01-29: qty 6

## 2018-01-29 MED ORDER — SODIUM CHLORIDE 0.9 % IV SOLN
1.0000 g | Freq: Once | INTRAVENOUS | Status: AC
  Administered 2018-01-29: 1 g via INTRAVENOUS
  Filled 2018-01-29: qty 10

## 2018-01-29 NOTE — Discharge Instructions (Addendum)
Follow-up with your regular doctor if not better in 3 to 5 days.  Return emergency department if worsening.  Take medication as prescribed.  You will not need to start your Z-Pak until tomorrow.  Use your inhaler with 2 puffs every 4-6 hours and if you are in a coughing fit he could use it, cough.

## 2018-01-29 NOTE — ED Provider Notes (Signed)
[facility] Emergency Department Provider Note  ____________________________________________   First MD Initiated Contact with Patient 01/29/18 1027     (approximate)  I have reviewed the triage vital signs and the nursing notes.   HISTORY  Chief Complaint Shortness of Breath; Cough; and Fever    HPI Bethany Bray is a 27 y.o. female presents emergency department complaining of cough and congestion with yellow to green mucus.  Positive wheezing.  Symptoms for 3 days.  Denies fever, chills, chest pain or shortness of breath.  States she was seen here yesterday for vomiting and diarrhea.  She states that has gotten a little bit better but she had one episode of diarrhea this morning.   Past Medical History:  Diagnosis Date  . Anemia   . Asthma     There are no active problems to display for this patient.   Past Surgical History:  Procedure Laterality Date  . CESAREAN SECTION      Prior to Admission medications   Medication Sig Start Date End Date Taking? Authorizing Provider  albuterol (PROAIR HFA) 108 (90 BASE) MCG/ACT inhaler Inhale 1 puff into the lungs every 6 (six) hours as needed for wheezing or shortness of breath.  02/14/14   [provider]  albuterol (PROVENTIL HFA;VENTOLIN HFA) 108 (90 Base) MCG/ACT inhaler Inhale 2 puffs into the lungs every 6 (six) hours as needed for wheezing or shortness of breath. 01/29/18   Fisher, Roselyn Bering, PA-C  azithromycin (ZITHROMAX Z-PAK) 250 MG tablet 2 pills today then 1 pill a day for 4 days 01/29/18   Sherrie Mustache Roselyn Bering, PA-C  diclofenac (CATAFLAM) 50 MG tablet Take 1 tablet (50 mg total) by mouth 3 (three) times daily. 07/12/15   Linna Hoff, MD  dicyclomine (BENTYL) 20 MG tablet Take 1 tablet (20 mg total) by mouth 3 (three) times daily as needed for spasms. 01/28/18   Emily Filbert, MD  methocarbamol (ROBAXIN) 500 MG tablet Take 1 tablet (500 mg total) by mouth 3 (three) times daily.  07/12/15   Linna Hoff, MD  predniSONE (DELTASONE) 50 MG tablet Take 1 tablet a day for 5 days 02/21/15   Barrett, Rolm Gala, PA-C    Allergies Patient has no known allergies.  No family history on file.  Social History Social History   Tobacco Use  . Smoking status: Never Smoker  . Smokeless tobacco: Never Used  Substance Use Topics  . Alcohol use: No  . Drug use: No    Review of Systems  Constitutional: No fever/chills Eyes: No visual changes. ENT: No sore throat. Respiratory: Positive cough and congestion, positive wheezing Genitourinary: Negative for dysuria. Musculoskeletal: Negative for back pain. Skin: Negative for rash.    ____________________________________________   PHYSICAL EXAM:  VITAL SIGNS: ED Triage Vitals  Enc Vitals Group     BP 01/29/18 0945 123/65     Pulse Rate 01/29/18 0944 93     Resp 01/29/18 0944 20     Temp 01/29/18 0944 98.9 F (37.2 C)     Temp Source 01/29/18 0944 Oral     SpO2 01/29/18 0944 97 %     Weight 01/29/18 0945 195 lb (88.5 kg)     Height 01/29/18 0945 5' (1.524 m)     Head Circumference --      Peak Flow --      Pain Score 01/29/18 0945 0     Pain Loc --      Pain Edu? --  Excl. in GC? --     Constitutional: Alert and oriented. Well appearing and in no acute distress. Eyes: Conjunctivae are normal.  Head: Atraumatic. ENT: TMS clear bilaterally Nose: No congestion/rhinnorhea. Mouth/Throat: Mucous membranes are moist.   NECK: Is supple, no lymphadenopathy is noted  cardiovascular: Normal rate, regular rhythm.  Heart sounds are normal Respiratory: Normal respiratory effort.  No retractions, lungs with wheezing bilaterally GU: deferred Musculoskeletal: FROM all extremities, warm and well perfused Neurologic:  Normal speech and language.  Skin:  Skin is warm, dry and intact. No rash noted. Psychiatric: Mood and affect are normal. Speech and behavior are normal.  ____________________________________________     LABS (all labs ordered are listed, but only abnormal results are displayed)  Labs Reviewed  CBC WITH DIFFERENTIAL/PLATELET - Abnormal; Notable for the following components:      Result Value   Neutro Abs 8.9 (*)    Lymphs Abs 0.9 (*)    All other components within normal limits   ____________________________________________   ____________________________________________  RADIOLOGY  Chest x-ray shows a right upper lobe pneumonia  ____________________________________________   PROCEDURES  Procedure(s) performed: Saline lock, Solu-Medrol 125 IV, normal saline 1 L, Rocephin 1 g IV, repeat DuoNeb  Procedures    ____________________________________________   INITIAL IMPRESSION / ASSESSMENT AND PLAN / ED COURSE  Pertinent labs & imaging results that were available during my care of the patient were reviewed by me and considered in my medical decision making (see chart for details).   Patient is a 27 year old female presents emergency department complaining of cough and congestion with wheezing.  She seen her yesterday for a viral illness.  States she had vomiting diarrhea.  One episode of diarrhea today.  No vomiting.  She is mostly concerned about not being able to breathe.  States more difficult when she lies down.  On physical exam the patient appears well.  She is eating graham crackers and peanut butter with her child on the bed.  She was given an albuterol nebulizer treatment in triage.  Patient still has large amount of wheezing.  Remainder the exam is unremarkable  Chest x-ray CBC Saline lock, Solu-Medrol 125 mg IV, normal saline 1 L   Chest x-ray shows a right upper lobe pneumonia.  CBC is normal.  Patient states she feels little better after the one treatment she was given in triage.  A second treatment was given while in the flex area.  States this made her feel much better.  She was given Solu-Medrol through her IV.  She was given Rocephin 1 g IV due to the  community-acquired pneumonia.  She was given a prescription for Z-Pak and albuterol inhaler.  She is to follow-up with her regular doctor if not better in 3 days.  Return emergency department if worsening.  She was given a work note stating that she has pneumonia and should not work and so she is been fever free for 24 to 48 hours.  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  She states she understands will comply with my instructions.  She was discharged in stable condition  As part of my medical decision making, I reviewed the following data within the electronic MEDICAL RECORD NUMBER Nursing notes reviewed and incorporated, Labs reviewed CBC normal, Old chart reviewed, Radiograph reviewed chest x-ray shows right upper lobe pneumonia, Notes from prior ED visits and [place] Controlled Substance Database  ____________________________________________   FINAL CLINICAL  IMPRESSION(S) / ED DIAGNOSES  Final diagnoses:  Community acquired pneumonia of right upper lobe of lung (HCC)      NEW MEDICATIONS STARTED DURING THIS VISIT:  Discharge Medication List as of 01/29/2018  1:08 PM    START taking these medications   Details  !! albuterol (PROVENTIL HFA;VENTOLIN HFA) 108 (90 Base) MCG/ACT inhaler Inhale 2 puffs into the lungs every 6 (six) hours as needed for wheezing or shortness of breath., Starting Thu 01/29/2018, Print    azithromycin (ZITHROMAX Z-PAK) 250 MG tablet 2 pills today then 1 pill a day for 4 days, Print     !! - Potential duplicate medications found. Please discuss with provider.       Note:  This document was prepared using Dragon voice recognition software and may include unintentional dictation errors.     Faythe Ghee, PA-C 01/29/18 1424    Jene Every, MD 01/29/18 1438

## 2018-01-29 NOTE — ED Triage Notes (Signed)
Pt reports was seen here yesterday fora  Virus but still coughing up green phlem and having SOB and fevers.

## 2018-05-14 ENCOUNTER — Encounter: Payer: Self-pay | Admitting: *Deleted

## 2018-05-14 ENCOUNTER — Emergency Department
Admission: EM | Admit: 2018-05-14 | Discharge: 2018-05-14 | Disposition: A | Discharge status: Home / Self Care | Payer: Self-pay | Attending: Emergency Medicine | Admitting: Emergency Medicine

## 2018-05-14 ENCOUNTER — Emergency Department: Payer: Self-pay

## 2018-05-14 DIAGNOSIS — M545 Low back pain, unspecified: Secondary | ICD-10-CM

## 2018-05-14 DIAGNOSIS — J45909 Unspecified asthma, uncomplicated: Secondary | ICD-10-CM | POA: Insufficient documentation

## 2018-05-14 DIAGNOSIS — R1031 Right lower quadrant pain: Secondary | ICD-10-CM | POA: Insufficient documentation

## 2018-05-14 LAB — URINALYSIS, COMPLETE (UACMP) WITH MICROSCOPIC
BACTERIA UA: NONE SEEN
Bilirubin Urine: NEGATIVE
GLUCOSE, UA: NEGATIVE mg/dL
HGB URINE DIPSTICK: NEGATIVE
Ketones, ur: NEGATIVE mg/dL
Leukocytes, UA: NEGATIVE
Nitrite: NEGATIVE
Protein, ur: NEGATIVE mg/dL
SPECIFIC GRAVITY, URINE: 1.023 (ref 1.005–1.030)
pH: 6 (ref 5.0–8.0)

## 2018-05-14 LAB — COMPREHENSIVE METABOLIC PANEL
ALBUMIN: 4.1 g/dL (ref 3.5–5.0)
ALT: 13 U/L (ref 0–44)
AST: 16 U/L (ref 15–41)
Alkaline Phosphatase: 53 U/L (ref 38–126)
Anion gap: 7 (ref 5–15)
BUN: 13 mg/dL (ref 6–20)
CHLORIDE: 105 mmol/L (ref 98–111)
CO2: 24 mmol/L (ref 22–32)
CREATININE: 0.7 mg/dL (ref 0.44–1.00)
Calcium: 9.2 mg/dL (ref 8.9–10.3)
GFR calc Af Amer: >60 mL/min (ref >60)
GLUCOSE: 73 mg/dL (ref 70–99)
POTASSIUM: 4.1 mmol/L (ref 3.5–5.1)
Sodium: 136 mmol/L (ref 135–145)
Total Bilirubin: 0.8 mg/dL (ref 0.3–1.2)
Total Protein: 7.7 g/dL (ref 6.5–8.1)

## 2018-05-14 LAB — CBC WITH DIFFERENTIAL/PLATELET
ABS IMMATURE GRANULOCYTES: 0.02 10*3/uL (ref 0.00–0.07)
BASOS ABS: 0 10*3/uL (ref 0.0–0.1)
BASOS PCT: 0 %
EOS ABS: 0.2 10*3/uL (ref 0.0–0.5)
Eosinophils Relative: 2 %
HCT: 41.4 % (ref 36.0–46.0)
Hemoglobin: 13.1 g/dL (ref 12.0–15.0)
IMMATURE GRANULOCYTES: 0 %
LYMPHS ABS: 2.2 10*3/uL (ref 0.7–4.0)
Lymphocytes Relative: 24 %
MCH: 26.3 pg (ref 26.0–34.0)
MCHC: 31.6 g/dL (ref 30.0–36.0)
MCV: 83.1 fL (ref 80.0–100.0)
MONOS PCT: 6 %
Monocytes Absolute: 0.5 10*3/uL (ref 0.1–1.0)
NEUTROS ABS: 6.3 10*3/uL (ref 1.7–7.7)
NEUTROS PCT: 68 %
NRBC: 0 % (ref 0.0–0.2)
PLATELETS: 365 10*3/uL (ref 150–400)
RBC: 4.98 MIL/uL (ref 3.87–5.11)
RDW: 13.2 % (ref 11.5–15.5)
WBC: 9.3 10*3/uL (ref 4.0–10.5)

## 2018-05-14 LAB — PREGNANCY, URINE: Preg Test, Ur: NEGATIVE

## 2018-05-14 MED ORDER — IOHEXOL 300 MG/ML  SOLN
100.0000 mL | Freq: Once | INTRAMUSCULAR | Status: AC | PRN
  Administered 2018-05-14: 100 mL via INTRAVENOUS
  Filled 2018-05-14: qty 100

## 2018-05-14 MED ORDER — MELOXICAM 15 MG PO TABS: 15.0000 mg | ORAL_TABLET | Freq: Every day | ORAL | 2 refills | Status: DC

## 2018-05-14 MED ORDER — BACLOFEN 10 MG PO TABS: 10.0000 mg | ORAL_TABLET | Freq: Three times a day (TID) | ORAL | 1 refills | Status: DC

## 2018-05-14 NOTE — Discharge Instructions (Addendum)
Follow-up with your regular doctor if not better in 5 to 7 days.  Return emergency department worsening.  Use medications as prescribed.  Drink plenty of fluids.

## 2018-05-14 NOTE — ED Provider Notes (Signed)
[facility] Emergency Department Provider Note  ____________________________________________   First MD Initiated Contact with Patient 05/14/18 1115     (approximate)  I have reviewed the triage vital signs and the nursing notes.   HISTORY  Chief Complaint Hip Pain and Pelvic Pain    HPI Bethany IdlerStephanie Bray is a 28 y.o. female presents to the emergency department with low back pain and right lower quadrant pain.  She states that she has a history of gallstones and was concerned that this could be the problem.  States pain is very sharp and is aggravated by walking.  She denies any vomiting/diarrhea.  She denies any fever or chills.    Past Medical History:  Diagnosis Date  . Anemia   . Asthma     There are no active problems to display for this patient.   Past Surgical History:  Procedure Laterality Date  . CESAREAN SECTION      Prior to Admission medications   Medication Sig Start Date End Date Taking? Authorizing Provider  albuterol (PROAIR HFA) 108 (90 BASE) MCG/ACT inhaler Inhale 1 puff into the lungs every 6 (six) hours as needed for wheezing or shortness of breath.  02/14/14   [provider]  albuterol (PROVENTIL HFA;VENTOLIN HFA) 108 (90 Base) MCG/ACT inhaler Inhale 2 puffs into the lungs every 6 (six) hours as needed for wheezing or shortness of breath. 01/29/18   , Roselyn Bering W, PA-C  baclofen (LIORESAL) 10 MG tablet Take 1 tablet (10 mg total) by mouth 3 (three) times daily. 05/14/18 05/14/19  , Roselyn Bering W, PA-C  dicyclomine (BENTYL) 20 MG tablet Take 1 tablet (20 mg total) by mouth 3 (three) times daily as needed for spasms. 01/28/18   Emily FilbertWilliams, Jonathan E, MD  meloxicam (MOBIC) 15 MG tablet Take 1 tablet (15 mg total) by mouth daily. 05/14/18 05/14/19  Faythe Ghee,  W, PA-C    Allergies Patient has no known allergies.  No family history on file.  Social History Social History   Tobacco Use  . Smoking status: Never Smoker   . Smokeless tobacco: Never Used  Substance Use Topics  . Alcohol use: No  . Drug use: No    Review of Systems  Constitutional: No fever/chills Eyes: No visual changes. ENT: No sore throat. Respiratory: Denies cough Gastrointestinal: Positive right lower quadrant pain Genitourinary: Negative for dysuria. Musculoskeletal: Negative for back pain. Skin: Negative for rash.    ____________________________________________   PHYSICAL EXAM:  VITAL SIGNS: ED Triage Vitals  Enc Vitals Group     BP 05/14/18 1020 (!) 125/103     Pulse Rate 05/14/18 1020 80     Resp 05/14/18 1020 16     Temp 05/14/18 1020 98.4 F (36.9 C)     Temp Source 05/14/18 1020 Oral     SpO2 05/14/18 1020 99 %     Weight 05/14/18 1021 196 lb (88.9 kg)     Height 05/14/18 1021 5' (1.524 m)     Head Circumference --      Peak Flow --      Pain Score 05/14/18 1020 7     Pain Loc --      Pain Edu? --      Excl. in GC? --     Constitutional: Alert and oriented. Well appearing and in no acute distress. Eyes: Conjunctivae are normal.  Head: Atraumatic. Nose: No congestion/rhinnorhea. Mouth/Throat: Mucous membranes are moist.   Neck:  supple no lymphadenopathy noted Cardiovascular: Normal rate, regular  rhythm. Heart sounds are normal Respiratory: Normal respiratory effort.  No retractions, lungs c t a  Abd: soft tender in the right lower quadrant, Bs normal all 4 quad GU: deferred Musculoskeletal: FROM all extremities, warm and well perfused, lumbar spine is mildly tender, neurovascular is intact Neurologic:  Normal speech and language.  Skin:  Skin is warm, dry and intact. No rash noted. Psychiatric: Mood and affect are normal. Speech and behavior are normal.  ____________________________________________   LABS (all labs ordered are listed, but only abnormal results are displayed)  Labs Reviewed  URINALYSIS, COMPLETE (UACMP) WITH MICROSCOPIC - Abnormal; Notable for the following components:       Result Value   Color, Urine YELLOW (*)    APPearance CLEAR (*)    All other components within normal limits  PREGNANCY, URINE  CBC WITH DIFFERENTIAL/PLATELET  COMPREHENSIVE METABOLIC PANEL   ____________________________________________   ____________________________________________  RADIOLOGY  CT abdomen/pelvis with IV contrast is negative for any acute abnormality  ____________________________________________   PROCEDURES  Procedure(s) performed: No  Procedures    ____________________________________________   INITIAL IMPRESSION / ASSESSMENT AND PLAN / ED COURSE  Pertinent labs & imaging results that were available during my care of the patient were reviewed by me and considered in my medical decision making (see chart for details).   Patient is 28 year old female presents emergency department complaining of low back pain and right lower quadrant pain.  Physical exam shows right lower quadrant tenderness with mild lumbar spine tenderness  CBC is normal, comprehensive metabolic panel is normal, UA is normal, POC pregnancy is negative  CT abdomen/pelvis with IV contrast was negative for any acute abnormality  Explained all of the findings to the patient.  She was given a prescription for meloxicam and baclofen.  She is to apply ice to the lower back.  Return emergency department if the right lower quadrant pain is increasing.  She states she understands will comply.  She discharged stable condition.     As part of my medical decision making, I reviewed the following data within the electronic MEDICAL RECORD NUMBER Nursing notes reviewed and incorporated, Labs reviewed CBC, UA, complete metabolic panel are all normal, POC pregnancy is negative, Old chart reviewed, Radiograph reviewed CT abdomen pelvis is negative, Notes from prior ED visits and [place] Controlled Substance Database  ____________________________________________   FINAL CLINICAL IMPRESSION(S) / ED  DIAGNOSES  Final diagnoses:  Acute midline low back pain without sciatica  Right lower quadrant pain      NEW MEDICATIONS STARTED DURING THIS VISIT:  Discharge Medication List as of 05/14/2018  2:10 PM    START taking these medications   Details  baclofen (LIORESAL) 10 MG tablet Take 1 tablet (10 mg total) by mouth 3 (three) times daily., Starting Thu 05/14/2018, Until Fri 05/14/2019, Normal    meloxicam (MOBIC) 15 MG tablet Take 1 tablet (15 mg total) by mouth daily., Starting Thu 05/14/2018, Until Fri 05/14/2019, Normal         Note:  This document was prepared using Dragon voice recognition software and may include unintentional dictation errors.    Faythe Ghee, PA-C 05/14/18 1750    Jene Every, MD 05/15/18 1215

## 2018-05-14 NOTE — ED Notes (Signed)
See triage note  Presents with right sided abd pain and flank pain  States pain radiates into hip area  Increased pain with ambulation

## 2018-05-14 NOTE — ED Notes (Signed)
Collected light green top, sent to lab.

## 2018-05-14 NOTE — ED Triage Notes (Signed)
Pt is here with pain in right hip (down by hip joint). Pt states that pain increases when she moves and pain radiates to back and into stomach.  Pt denies any urinary symptoms with this and states that she is not pregnant, she has implant.  Pt states that this began during the night

## 2018-09-06 ENCOUNTER — Encounter: Payer: Self-pay | Admitting: Emergency Medicine

## 2018-09-06 ENCOUNTER — Other Ambulatory Visit: Payer: Self-pay

## 2018-09-06 ENCOUNTER — Emergency Department
Admission: EM | Admit: 2018-09-06 | Discharge: 2018-09-06 | Disposition: A | Discharge status: Home / Self Care | Payer: Managed Care, Other (non HMO) | Attending: Student in an Organized Health Care Education/Training Program | Admitting: Student in an Organized Health Care Education/Training Program

## 2018-09-06 DIAGNOSIS — J45909 Unspecified asthma, uncomplicated: Secondary | ICD-10-CM | POA: Diagnosis not present

## 2018-09-06 DIAGNOSIS — R102 Pelvic and perineal pain: Secondary | ICD-10-CM

## 2018-09-06 DIAGNOSIS — Z79899 Other long term (current) drug therapy: Secondary | ICD-10-CM | POA: Diagnosis not present

## 2018-09-06 LAB — URINALYSIS, COMPLETE (UACMP) WITH MICROSCOPIC
Bacteria, UA: NONE SEEN
Bilirubin Urine: NEGATIVE
Glucose, UA: NEGATIVE mg/dL
Hgb urine dipstick: NEGATIVE
Ketones, ur: NEGATIVE mg/dL
Leukocytes,Ua: NEGATIVE
Nitrite: NEGATIVE
Protein, ur: NEGATIVE mg/dL
Specific Gravity, Urine: 1.016 (ref 1.005–1.030)
pH: 7 (ref 5.0–8.0)

## 2018-09-06 LAB — COMPREHENSIVE METABOLIC PANEL
ALT: 14 U/L (ref 0–44)
AST: 16 U/L (ref 15–41)
Albumin: 4 g/dL (ref 3.5–5.0)
Alkaline Phosphatase: 53 U/L (ref 38–126)
Anion gap: 7 (ref 5–15)
BUN: 15 mg/dL (ref 6–20)
CO2: 23 mmol/L (ref 22–32)
Calcium: 8.7 mg/dL — ABNORMAL LOW (ref 8.9–10.3)
Chloride: 106 mmol/L (ref 98–111)
Creatinine, Ser: 0.57 mg/dL (ref 0.44–1.00)
GFR calc Af Amer: >60 mL/min (ref >60)
GFR calc non Af Amer: >60 mL/min (ref >60)
Glucose, Bld: 157 mg/dL — ABNORMAL HIGH (ref 70–99)
Potassium: 4.2 mmol/L (ref 3.5–5.1)
Sodium: 136 mmol/L (ref 135–145)
Total Bilirubin: 0.8 mg/dL (ref 0.3–1.2)
Total Protein: 7.2 g/dL (ref 6.5–8.1)

## 2018-09-06 LAB — POCT PREGNANCY, URINE: Preg Test, Ur: NEGATIVE

## 2018-09-06 LAB — CBC
HCT: 40.8 % (ref 36.0–46.0)
Hemoglobin: 13.2 g/dL (ref 12.0–15.0)
MCH: 26.7 pg (ref 26.0–34.0)
MCHC: 32.4 g/dL (ref 30.0–36.0)
MCV: 82.4 fL (ref 80.0–100.0)
Platelets: 289 10*3/uL (ref 150–400)
RBC: 4.95 MIL/uL (ref 3.87–5.11)
RDW: 13.3 % (ref 11.5–15.5)
WBC: 9.7 10*3/uL (ref 4.0–10.5)
nRBC: 0 % (ref 0.0–0.2)

## 2018-09-06 LAB — LIPASE, BLOOD: Lipase: 32 U/L (ref 11–51)

## 2018-09-06 MED ORDER — SODIUM CHLORIDE 0.9% FLUSH: 3.0000 mL | Freq: Once | INTRAVENOUS | Status: DC

## 2018-09-06 NOTE — Discharge Instructions (Signed)
Your exam and labs are normal are reassuring at this time. You may be experiencing some mild pre-menstrual symptoms including cramping and breast tenderness. Take OTC ibuprofen and follow-up with your PCP if symptoms continue.

## 2018-09-06 NOTE — ED Provider Notes (Signed)
[facility] Emergency Department Provider Note ____________________________________________  Time seen: 1210  I have reviewed the triage vital signs and the nursing notes.  HISTORY  Chief Complaint  Abdominal Pain  HPI Bethany IdlerStephanie Bray is a 28 y.o. female presents herself to the ED with a 2-day complaint of some lower abdominal cramping.  She denies any nausea, vomiting, diarrhea, or constipation.  Patient is also noted some breast tenderness as well.  She reports she has Implanon in place for the last 2 years and has not had any abnormal vaginal bleeding.  She also denies any sick contacts, bad food, recent travel, or other high risk exposures.  Patient admits that the discomfort in her lower abdomen is familiar to her, described as menstrual cramping.  She denies a history of ovarian cysts, uterine fibroids, or endometriosis. Patient has otherwise had no other complaints at this time including chest pain, headache, food aversion, or congestion.  Patient is a healthy female with no significant medical history and takes no daily medications.  Past Medical History:  Diagnosis Date  . Anemia   . Asthma     There are no active problems to display for this patient.   Past Surgical History:  Procedure Laterality Date  . CESAREAN SECTION      Prior to Admission medications   Medication Sig Start Date End Date Taking? Authorizing Provider  albuterol (PROVENTIL HFA;VENTOLIN HFA) 108 (90 Base) MCG/ACT inhaler Inhale 2 puffs into the lungs every 6 (six) hours as needed for wheezing or shortness of breath. 01/29/18   Faythe GheeFisher, Susan W, PA-C    Allergies Patient has no known allergies.  No family history on file.  Social History Social History   Tobacco Use  . Smoking status: Never Smoker  . Smokeless tobacco: Never Used  Substance Use Topics  . Alcohol use: No  . Drug use: No    Review of Systems  Constitutional: Negative for fever. Eyes: Negative for  visual changes. ENT: Negative for sore throat. Cardiovascular: Negative for chest pain. Respiratory: Negative for shortness of breath. Gastrointestinal: Negative for abdominal pain, vomiting and diarrhea. Genitourinary: Negative for dysuria. Reports cramping Musculoskeletal: Negative for back pain. Reports breast tenderness. Skin: Negative for rash. Neurological: Negative for headaches, focal weakness or numbness. ____________________________________________  PHYSICAL EXAM:  VITAL SIGNS: ED Triage Vitals  Enc Vitals Group     BP 09/06/18 1055 114/70     Pulse Rate 09/06/18 1055 98     Resp 09/06/18 1055 18     Temp 09/06/18 1055 98.2 F (36.8 C)     Temp Source 09/06/18 1055 Oral     SpO2 09/06/18 1055 98 %     Weight 09/06/18 1052 185 lb (83.9 kg)     Height 09/06/18 1052 5' (1.524 m)     Head Circumference --      Peak Flow --      Pain Score 09/06/18 1052 8     Pain Loc --      Pain Edu? --      Excl. in GC? --     Constitutional: Alert and oriented. Well appearing and in no distress. Head: Normocephalic and atraumatic. Eyes: Conjunctivae are normal. Normal extraocular movements Cardiovascular: Normal rate, regular rhythm. Normal distal pulses. Respiratory: Normal respiratory effort. No wheezes/rales/rhonchi. Gastrointestinal: Soft, obese, and nontender. No distention, rebound, guarding, or rigidity.  No CVA tenderness elicited.  Normoactive bowel sounds noted. Musculoskeletal: Nontender with normal range of motion in all extremities.  Neurologic:  Normal gait without ataxia. Normal speech and language. No gross focal neurologic deficits are appreciated. Skin:  Skin is warm, dry and intact. No rash noted. Psychiatric: Mood and affect are normal. Patient exhibits appropriate insight and judgment. ____________________________________________   LABS (pertinent positives/negatives) Labs Reviewed  COMPREHENSIVE METABOLIC PANEL - Abnormal; Notable for the following  components:      Result Value   Glucose, Bld 157 (*)    Calcium 8.7 (*)    All other components within normal limits  URINALYSIS, COMPLETE (UACMP) WITH MICROSCOPIC - Abnormal; Notable for the following components:   Color, Urine YELLOW (*)    APPearance CLEAR (*)    All other components within normal limits  LIPASE, BLOOD  CBC  POC URINE PREG, ED  POCT PREGNANCY, URINE  ____________________________________________  PROCEDURES  Procedures ____________________________________________  INITIAL IMPRESSION / ASSESSMENT AND PLAN / ED COURSE  Martia Darius was evaluated in Emergency Department on 09/06/2018 for the symptoms described in the history of present illness. She was evaluated in the context of the global COVID-19 pandemic, which necessitated consideration that the patient might be at risk for infection with the SARS-CoV-2 virus that causes COVID-19. Institutional protocols and algorithms that pertain to the evaluation of patients at risk for COVID-19 are in a state of rapid change based on information released by regulatory bodies including the CDC and federal and state organizations. These policies and algorithms were followed during the patient's care in the ED.  Differential diagnosis includes, but is not limited to, ovarian cyst, ovarian torsion, acute appendicitis, diverticulitis, urinary tract infection/pyelonephritis, endometriosis, bowel obstruction, colitis, renal colic, gastroenteritis, hernia, fibroids, endometriosis, pregnancy related pain including ectopic pregnancy, etc.  Patient with ED evaluation of a 2-day complaint of some mild pelvic cramping and breast tenderness.  Patient's exam is overall benign and reassuring at this time.  Labs are also within normal limits without any indication of an acute abdominal process.  Patient tolerating food without inciting nausea, vomiting, or diarrhea.  Her symptoms may represent a mild premenstrual syndrome due to hormone therapy.   She denies any other complaints or concerns at this time.  Patient be discharged with primary provider or return to the ED as needed.  She may take over-the-counter ibuprofen as needed for her symptoms. ____________________________________________  FINAL CLINICAL IMPRESSION(S) / ED DIAGNOSES  Final diagnoses:  Pelvic cramping      Callum Wolf, Charlesetta Ivory, PA-C 09/06/18 1230    Willy Eddy, MD 09/06/18 1248

## 2018-09-06 NOTE — ED Triage Notes (Signed)
Pt to ED via POV c/o abdominal pain x 2 days, consistent since 10 pm last night. Pt denies N/V/D. Pt states that she also is having a lot of pain in her breast. Pt is in NAD.

## 2018-11-11 ENCOUNTER — Other Ambulatory Visit: Payer: Self-pay

## 2018-11-11 ENCOUNTER — Encounter: Payer: Self-pay | Admitting: Emergency Medicine

## 2018-11-11 ENCOUNTER — Emergency Department
Admission: EM | Admit: 2018-11-11 | Discharge: 2018-11-11 | Disposition: A | Discharge status: Home / Self Care | Payer: Managed Care, Other (non HMO) | Attending: Emergency Medicine | Admitting: Emergency Medicine

## 2018-11-11 DIAGNOSIS — J029 Acute pharyngitis, unspecified: Secondary | ICD-10-CM | POA: Diagnosis present

## 2018-11-11 DIAGNOSIS — J45909 Unspecified asthma, uncomplicated: Secondary | ICD-10-CM | POA: Insufficient documentation

## 2018-11-11 LAB — GROUP A STREP BY PCR: Group A Strep by PCR: NOT DETECTED

## 2018-11-11 NOTE — ED Triage Notes (Signed)
Patient presents to the ED with a sore throat that began Monday am.  Patient states she was tested on Monday for Covid which came back negative today.  Patient states that she was not tested for strep throat.  Patient reports pain with swallowing.  Patient reports "head pressure" and coughing up yellow phlegm.

## 2018-11-11 NOTE — ED Provider Notes (Signed)
[facility] Emergency Department Provider Note  ____________________________________________   First MD Initiated Contact with Patient 11/11/18 1048     (approximate)  I have reviewed the triage vital signs and the nursing notes.   HISTORY  Chief Complaint Sore Throat   HPI Bethany Bray is a 28 y.o. female presents to the ED with complaint of sore throat that began 3 days ago.  Patient states that Monday she saw her PCP and was tested for COVID which was negative.  She reports productive cough and nasal congestion.  She is unaware of any fever.  She denies any contact with anyone knowingly positive.  Patient is a smoker.  She rates her pain as 9/10.      Past Medical History:  Diagnosis Date  . Anemia   . Asthma     There are no active problems to display for this patient.   Past Surgical History:  Procedure Laterality Date  . CESAREAN SECTION      Prior to Admission medications   Medication Sig Start Date End Date Taking? Authorizing Provider  albuterol (PROVENTIL HFA;VENTOLIN HFA) 108 (90 Base) MCG/ACT inhaler Inhale 2 puffs into the lungs every 6 (six) hours as needed for wheezing or shortness of breath. 01/29/18   Faythe GheeFisher, Susan W, PA-C    Allergies Patient has no known allergies.  No family history on file.  Social History Social History   Tobacco Use  . Smoking status: Never Smoker  . Smokeless tobacco: Never Used  Substance Use Topics  . Alcohol use: No  . Drug use: No    Review of Systems Constitutional: No fever/chills Eyes: No visual changes. ENT: Positive sore throat. Cardiovascular: Denies chest pain. Respiratory: Denies shortness of breath. Gastrointestinal: No abdominal pain.  No nausea, no vomiting.  Musculoskeletal: Negative for muscle aches. Skin: Negative for rash. Neurological: Negative for headaches, focal weakness or numbness. ____________________________________________   PHYSICAL EXAM:  VITAL  SIGNS: ED Triage Vitals  Enc Vitals Group     BP 11/11/18 1041 116/75     Pulse Rate 11/11/18 1041 95     Resp 11/11/18 1041 16     Temp 11/11/18 1041 98.7 F (37.1 C)     Temp Source 11/11/18 1041 Oral     SpO2 11/11/18 1041 98 %     Weight 11/11/18 1042 185 lb (83.9 kg)     Height 11/11/18 1042 5' (1.524 m)     Head Circumference --      Peak Flow --      Pain Score 11/11/18 1042 9     Pain Loc --      Pain Edu? --      Excl. in GC? --    Constitutional: Alert and oriented. Well appearing and in no acute distress. Eyes: Conjunctivae are normal.  Head: Atraumatic. Nose: No congestion/rhinnorhea. Mouth/Throat: Mucous membranes are moist.  Oropharynx mild erythema.  No tonsillar enlargement or exudate was seen.  Uvula is midline. Neck: No stridor.   Hematological/Lymphatic/Immunilogical: No cervical lymphadenopathy. Cardiovascular: Normal rate, regular rhythm. Grossly normal heart sounds.  Good peripheral circulation. Respiratory: Normal respiratory effort.  No retractions. Lungs CTAB. Musculoskeletal: Moves upper and lower extremities without any difficulty.  Normal gait was noted. Neurologic:  Normal speech and language. No gross focal neurologic deficits are appreciated. No gait instability. Skin:  Skin is warm, dry and intact. No rash noted. Psychiatric: Mood and affect are normal. Speech and behavior are normal.  ____________________________________________   LABS (all  labs ordered are listed, but only abnormal results are displayed)  Labs Reviewed  GROUP A STREP BY PCR     PROCEDURES  Procedure(s) performed (including Critical Care):  Procedures   ____________________________________________   INITIAL IMPRESSION / ASSESSMENT AND PLAN / ED COURSE  As part of my medical decision making, I reviewed the following data within the electronic MEDICAL RECORD NUMBER Notes from prior ED visits and [place] Controlled Substance Database  28 year old female presents to the ED  with complaint of sore throat.  She was seen at her PCPs office earlier in the week and had a COVID test done which was negative.  She denies any fever or chills.  She is having no difficulty speaking or swallowing.  She reports some nasal congestion with her symptoms.  Strep test was negative.  Patient was made aware.  She was discharged with instructions to increase fluids and take Tylenol or ibuprofen as needed for throat pain.  ____________________________________________   FINAL CLINICAL IMPRESSION(S) / ED DIAGNOSES  Final diagnoses:  Viral pharyngitis     ED Discharge Orders    None       Note:  This document was prepared using Dragon voice recognition software and may include unintentional dictation errors.    Johnn Hai, PA-C 11/11/18 1410    Earleen Newport, MD 11/11/18 [phone]

## 2018-11-11 NOTE — Discharge Instructions (Signed)
Follow-up with your primary care provider if any continued problems.  Increase fluids.  Tylenol or ibuprofen as needed for throat pain.  You may also need to take a decongestant over-the-counter such as Sudafed PE for nasal congestion.

## 2019-05-03 ENCOUNTER — Emergency Department
Admission: EM | Admit: 2019-05-03 | Discharge: 2019-05-03 | Disposition: A | Discharge status: Home / Self Care | Payer: Managed Care, Other (non HMO) | Attending: Emergency Medicine | Admitting: Emergency Medicine

## 2019-05-03 ENCOUNTER — Encounter: Payer: Self-pay | Admitting: Emergency Medicine

## 2019-05-03 ENCOUNTER — Other Ambulatory Visit: Payer: Self-pay

## 2019-05-03 DIAGNOSIS — R0602 Shortness of breath: Secondary | ICD-10-CM | POA: Diagnosis present

## 2019-05-03 DIAGNOSIS — J4541 Moderate persistent asthma with (acute) exacerbation: Secondary | ICD-10-CM

## 2019-05-03 MED ORDER — ALBUTEROL SULFATE HFA 108 (90 BASE) MCG/ACT IN AERS: 2.0000 | INHALATION_SPRAY | RESPIRATORY_TRACT | 1 refills | Status: DC | PRN

## 2019-05-03 MED ORDER — IPRATROPIUM-ALBUTEROL 20-100 MCG/ACT IN AERS: 1.0000 | INHALATION_SPRAY | Freq: Four times a day (QID) | RESPIRATORY_TRACT | 0 refills | Status: AC

## 2019-05-03 NOTE — ED Notes (Addendum)
NAD noted at time of D/C. Pt denies questions or concerns. Pt ambulatory to the lobby at this time. Unable to obtain E-sig due to E-sig pad not working.

## 2019-05-03 NOTE — ED Notes (Addendum)
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 Pt states had SOB, took inhaler PTA. Pt ambulatory without difficulty, able to speak in full and complete sentences upon arrival. Pt encouraged to slow her breathing down by this RN. Pt able to slow her breathing down some with encouragement.

## 2019-05-03 NOTE — ED Triage Notes (Signed)
Says she was at clients house andit was very warm in there.  Says she has to wear mask and shield and she has asthma and she got short of breath.  Has already used inhaler.

## 2019-05-03 NOTE — ED Provider Notes (Signed)
[facility] Emergency Department Provider Note  ____________________________________________  Time seen: Approximately 1:16 PM  I have reviewed the triage vital signs and the nursing notes.   HISTORY  Chief Complaint Asthma   HPI Bethany Bray is a 29 y.o. female presents to the emergency department for treatment and evaluation of mild shortness of breath and worsening asthma.  Patient states that she works for [employer] has to see a client in her home that has kerosene as her heat.  Patient states that it gives off a very strong smell.  She states that despite wearing her mask and face shield she is still able to smell the kerosene.  She states that wearing the mask and shield as well as the plastic gown makes her very hot especially in this environment and her asthma acts up every time she goes to this clients home.  No relief with her inhaler.  She denies other symptoms of concern.  She denies known exposure to COVID-19.    Past Medical History:  Diagnosis Date  . Anemia   . Asthma     There are no problems to display for this patient.   Past Surgical History:  Procedure Laterality Date  . CESAREAN SECTION      Prior to Admission medications   Medication Sig Start Date End Date Taking? Authorizing Provider  albuterol (VENTOLIN HFA) 108 (90 Base) MCG/ACT inhaler Inhale 2 puffs into the lungs every 4 (four) hours as needed for wheezing or shortness of breath. 05/03/19   Bethany Ramaker B, FNP  Ipratropium-Albuterol (COMBIVENT) 20-100 MCG/ACT AERS respimat Inhale 1 puff into the lungs every 6 (six) hours. 05/03/19   Bethany Dike, FNP    Allergies Patient has no known allergies.  No family history on file.  Social History Social History   Tobacco Use  . Smoking status: Never Smoker  . Smokeless tobacco: Never Used  Substance Use Topics  . Alcohol use: No  . Drug use: No    Review of Systems Constitutional: Negative for fever/chills.  Normal  appetite. ENT: Negative sore throat. Cardiovascular: Denies chest pain. Respiratory: Positive for shortness of breath.  Positive for cough.  Occasional wheezing.  Gastrointestinal: Negative for nausea, no vomiting.  No diarrhea.  Musculoskeletal: Negative for body aches Skin: Negative for rash. Neurological: Occasional for headaches ____________________________________________   PHYSICAL EXAM:  VITAL SIGNS: ED Triage Vitals  Enc Vitals Group     BP 05/03/19 1241 128/83     Pulse Rate 05/03/19 1244 90     Resp 05/03/19 1241 16     Temp 05/03/19 1241 97.9 F (36.6 C)     Temp Source 05/03/19 1241 Oral     SpO2 05/03/19 1244 100 %     Weight --      Height --      Head Circumference --      Peak Flow --      Pain Score 05/03/19 1242 0     Pain Loc --      Pain Edu? --      Excl. in [place]? --     Constitutional: Alert and oriented.  Overall well appearing and in no acute distress. Eyes: Conjunctivae are normal. Ears: Exam deferred Nose: No sinus congestion noted; no rhinnorhea. Mouth/Throat: Mucous membranes are moist.  Oropharynx clear. Tonsils flat. Uvula midline. Neck: No stridor.  Lymphatic: No cervical lymphadenopathy. Cardiovascular: Normal rate, regular rhythm. Good peripheral circulation. Respiratory: Respirations are even and unlabored.  No retractions.  Breath sounds clear throughout. Gastrointestinal: Soft and nontender.  Musculoskeletal: FROM x 4 extremities.  Neurologic:  Normal speech and language. Skin:  Skin is warm, dry and intact. No rash noted. Psychiatric: Mood and affect are normal. Speech and behavior are normal.  ____________________________________________   LABS (all labs ordered are listed, but only abnormal results are displayed)  Labs Reviewed - No data to display ____________________________________________  EKG  Not indicated ____________________________________________  RADIOLOGY  Not  indicated ____________________________________________   PROCEDURES  Procedure(s) performed: None  Critical Care performed: No ____________________________________________   INITIAL IMPRESSION / ASSESSMENT AND PLAN / ED COURSE  30 y.o. female presents to the emergency department for treatment and evaluation of more frequent asthma flares secondary to environment in her clients home.  See HPI for further details.  Patient states that when she uses her albuterol inhaler it does help some, but she has noticed that she has to use it much more often since working in her current job.  She does not feel that she is at the point of needing oral prednisone.  She states that she has had that in the past for severe asthma exacerbations and does not believe that it has gotten to that point.  She will be given a prescription for Combivent and albuterol.  She is to follow-up with her primary care provider or return to the emergency department for symptoms of change or worsen or for symptoms of concern.    Medications - No data to display  ED Discharge Orders         Ordered    albuterol (VENTOLIN HFA) 108 (90 Base) MCG/ACT inhaler  Every 4 hours PRN     05/03/19 1324    Ipratropium-Albuterol (COMBIVENT) 20-100 MCG/ACT AERS respimat  Every 6 hours     05/03/19 1324           Pertinent labs & imaging results that were available during my care of the patient were reviewed by me and considered in my medical decision making (see chart for details).    If controlled substance prescribed during this visit, 12 month history viewed on the [facility] prior to issuing an initial prescription for Schedule II or III opiod. ____________________________________________   FINAL CLINICAL IMPRESSION(S) / ED DIAGNOSES  Final diagnoses:  Moderate persistent asthma with exacerbation    Note:  This document was prepared using Dragon voice recognition software and may include unintentional dictation errors.     Chinita Pester, FNP 05/03/19 1842    Dionne Bucy, MD 05/09/19 [phone]

## 2019-10-05 IMAGING — CR DG CHEST 2V
1 series · 2 of 2 positions shown · non-contrast
Comparison: None.

CLINICAL DATA: Cough and shortness of breath

EXAM:
CHEST - 2 VIEW

[Series 1: dg chest 2 view · 0.14mm/px · 2 of 2 slices shown]
[im 1/2]
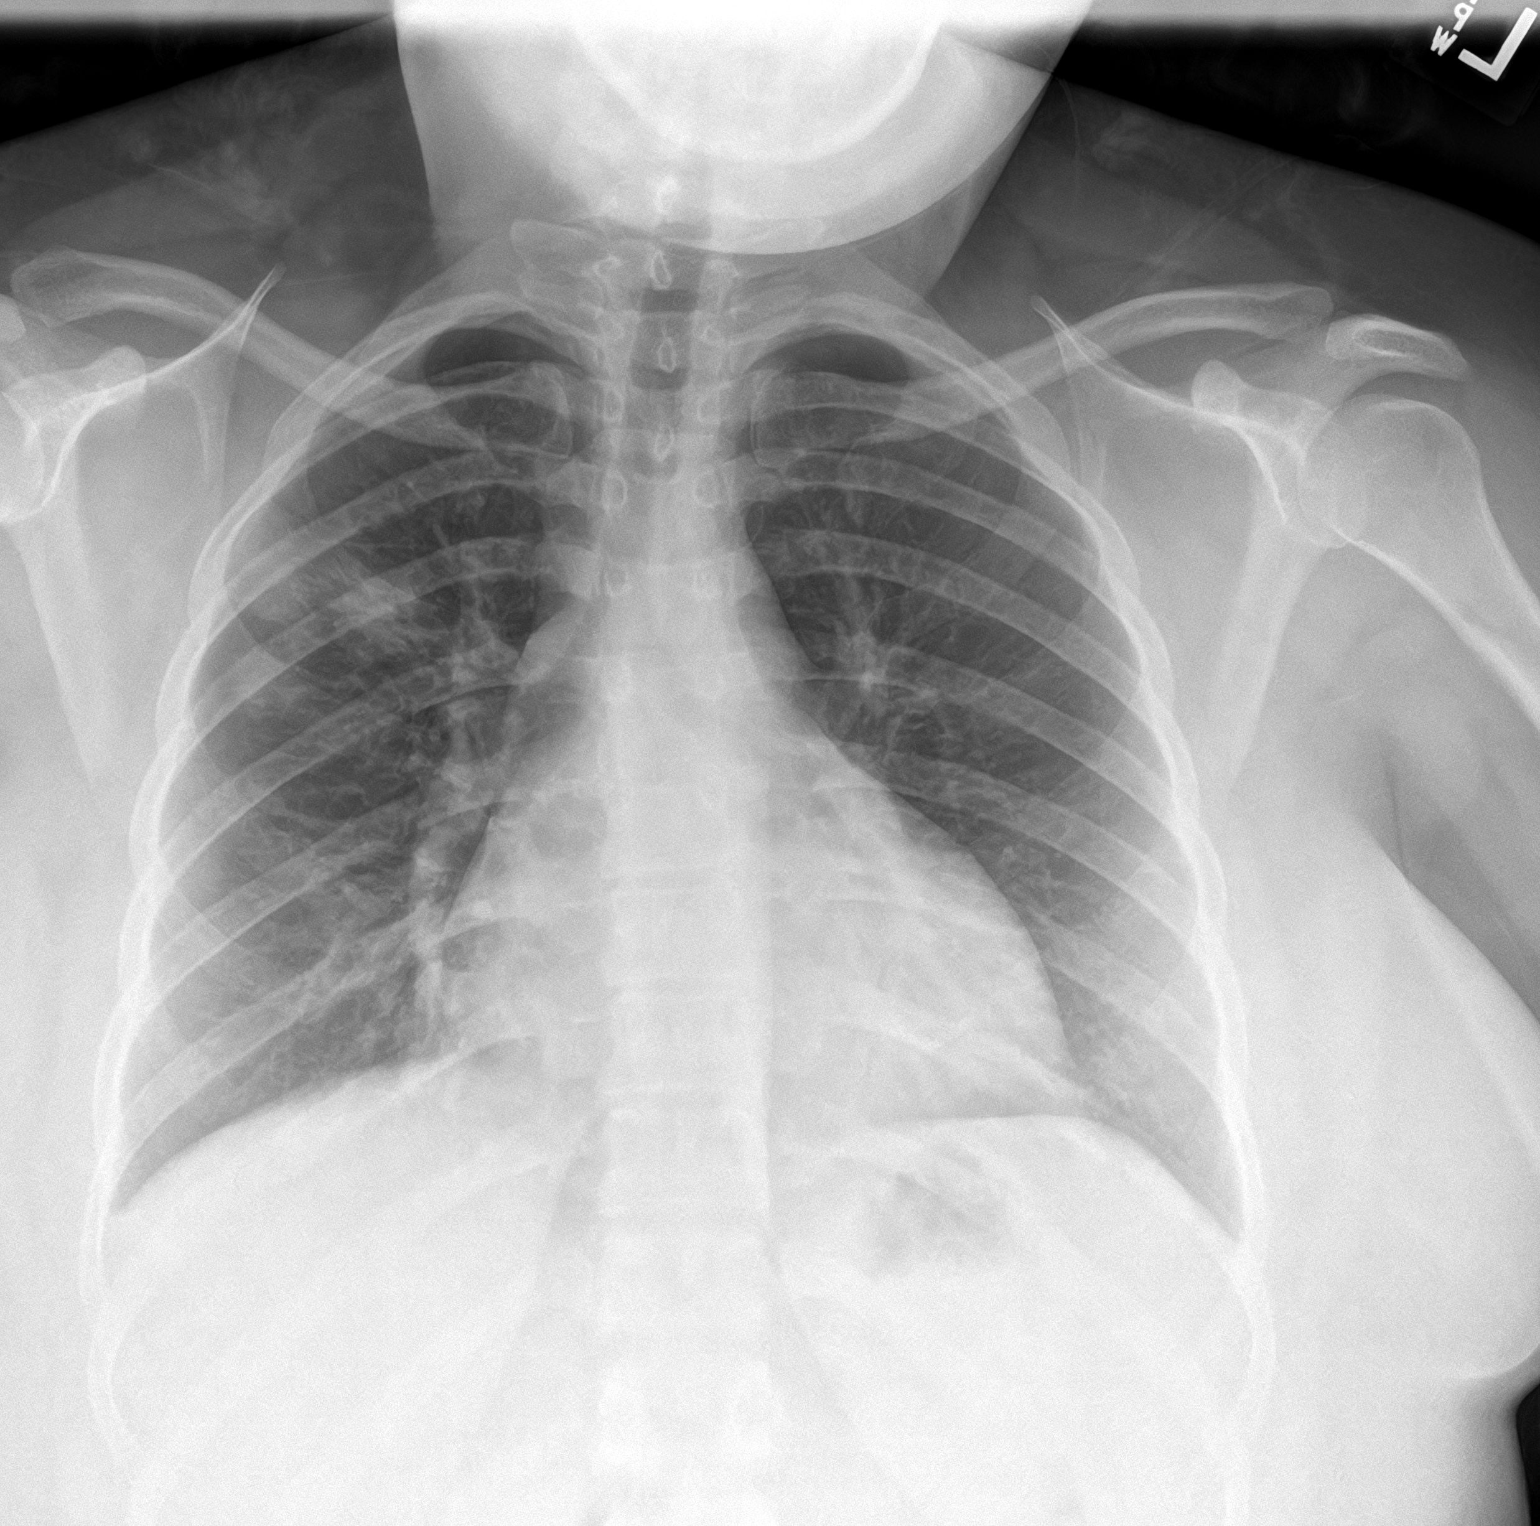
[im 2/2]
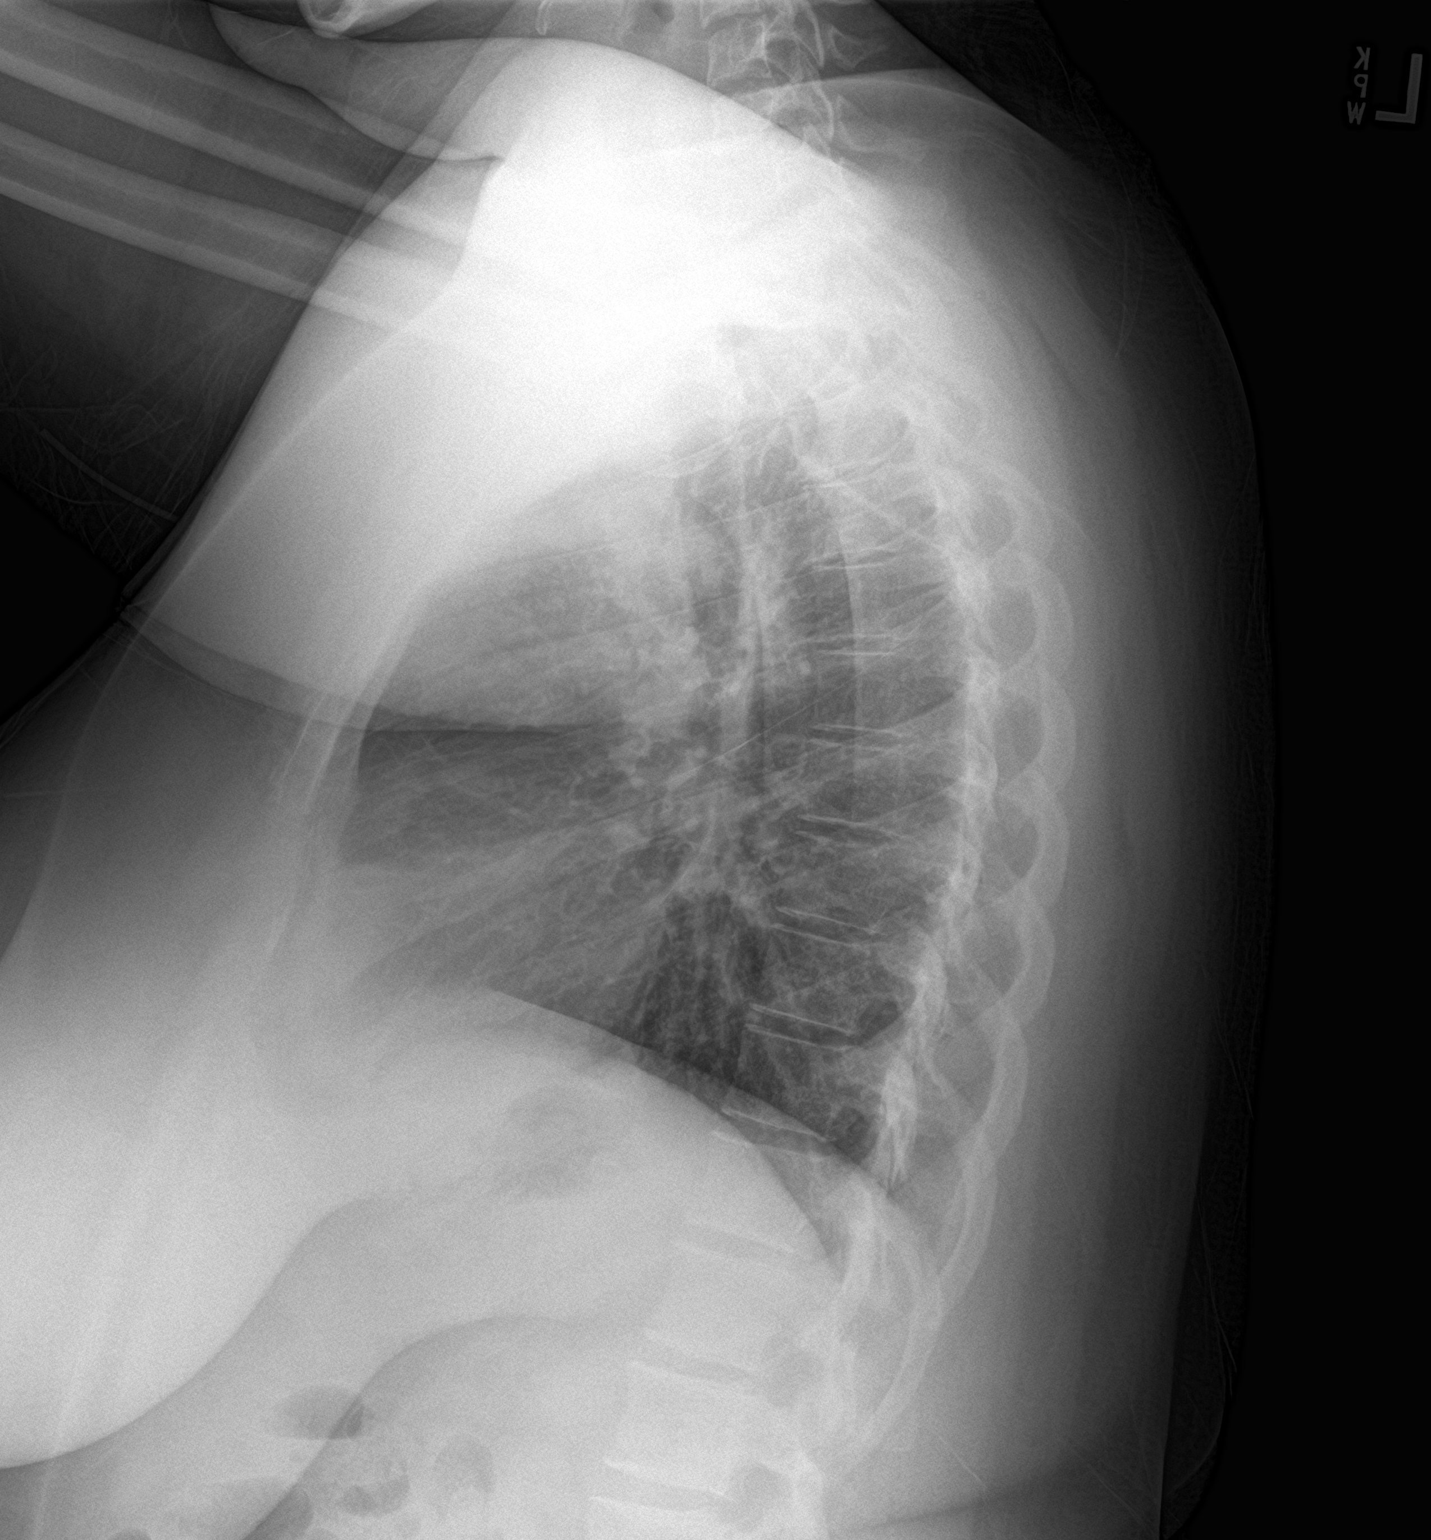

[2 of 2 positions shown; findings below may reference images not displayed]

FINDINGS: There is patchy airspace opacity in the anterior segment right upper
lobe. Lungs elsewhere are clear. Heart size and pulmonary
vascularity are normal. No adenopathy. No bone lesions.
IMPRESSION: Patchy airspace opacity consistent with pneumonia in anterior
segment right upper lobe. Lungs elsewhere clear. No adenopathy.

## 2019-10-26 ENCOUNTER — Emergency Department (HOSPITAL_COMMUNITY)
Admission: EM | Admit: 2019-10-26 | Discharge: 2019-10-26 | Disposition: A | Discharge status: Home / Self Care | Payer: Managed Care, Other (non HMO) | Attending: Emergency Medicine | Admitting: Emergency Medicine

## 2019-10-26 ENCOUNTER — Other Ambulatory Visit: Payer: Self-pay

## 2019-10-26 ENCOUNTER — Encounter (HOSPITAL_COMMUNITY): Payer: Self-pay

## 2019-10-26 DIAGNOSIS — Z20822 Contact with and (suspected) exposure to covid-19: Secondary | ICD-10-CM | POA: Insufficient documentation

## 2019-10-26 DIAGNOSIS — J45901 Unspecified asthma with (acute) exacerbation: Secondary | ICD-10-CM

## 2019-10-26 DIAGNOSIS — J4521 Mild intermittent asthma with (acute) exacerbation: Secondary | ICD-10-CM | POA: Insufficient documentation

## 2019-10-26 LAB — SARS CORONAVIRUS 2 BY RT PCR (HOSPITAL ORDER, PERFORMED IN ~~LOC~~ HOSPITAL LAB): SARS Coronavirus 2: NEGATIVE

## 2019-10-26 MED ORDER — ALBUTEROL SULFATE HFA 108 (90 BASE) MCG/ACT IN AERS
4.0000 | INHALATION_SPRAY | Freq: Once | RESPIRATORY_TRACT | Status: AC
  Administered 2019-10-26: 4 via RESPIRATORY_TRACT
  Filled 2019-10-26: qty 6.7

## 2019-10-26 MED ORDER — PREDNISONE 20 MG PO TABS: 60.0000 mg | ORAL_TABLET | Freq: Every day | ORAL | 0 refills | Status: AC

## 2019-10-26 MED ORDER — PREDNISONE 20 MG PO TABS: 60.0000 mg | ORAL_TABLET | Freq: Every day | ORAL | 0 refills | Status: DC

## 2019-10-26 MED ORDER — PREDNISONE 20 MG PO TABS
60.0000 mg | ORAL_TABLET | Freq: Once | ORAL | Status: AC
  Administered 2019-10-26: 60 mg via ORAL
  Filled 2019-10-26: qty 3

## 2019-10-26 NOTE — ED Provider Notes (Signed)
[facility] EMERGENCY DEPARTMENT Provider Note   CSN: [id number] Arrival date & time: 10/26/19  3329     History Chief Complaint  Patient presents with  . Shortness of Breath    Bethany Bray is a 29 y.o. female.  The history is provided by the patient.  Shortness of Breath Severity:  Moderate Onset quality:  Gradual Duration:  2 days Timing:  Intermittent Progression:  Waxing and waning Chronicity:  New Context: URI (cough, congestion, hx of asthma and chest tightness at times when tyring to take in a breath)   Relieved by:  Inhaler Worsened by:  Exertion Associated symptoms: cough and wheezing   Associated symptoms: no abdominal pain, no chest pain, no claudication, no ear pain, no fever, no rash, no sore throat and no vomiting   Risk factors: no hx of PE/DVT, no prolonged immobilization, no recent surgery and no tobacco use        Past Medical History:  Diagnosis Date  . Anemia   . Asthma     There are no problems to display for this patient.   Past Surgical History:  Procedure Laterality Date  . CESAREAN SECTION       OB History    Gravida  2   Para      Term      Preterm      AB      Living        SAB      TAB      Ectopic      Multiple      Live Births              No family history on file.  Social History   Tobacco Use  . Smoking status: Never Smoker  . Smokeless tobacco: Never Used  Vaping Use  . Vaping Use: Never used  Substance Use Topics  . Alcohol use: No  . Drug use: No    Home Medications Prior to Admission medications   Medication Sig Start Date End Date Taking? Authorizing Provider  albuterol (VENTOLIN HFA) 108 (90 Base) MCG/ACT inhaler Inhale 2 puffs into the lungs every 4 (four) hours as needed for wheezing or shortness of breath. 05/03/19   Triplett, Cari B, FNP  Ipratropium-Albuterol (COMBIVENT) 20-100 MCG/ACT AERS respimat Inhale 1 puff into the lungs every 6 (six) hours. 05/03/19    Triplett, Cari B, FNP  predniSONE (DELTASONE) 20 MG tablet Take 3 tablets (60 mg total) by mouth daily for 4 days. 10/27/19 10/31/19  Virgina Norfolk, DO    Allergies    Patient has no known allergies.  Review of Systems   Review of Systems  Constitutional: Negative for chills and fever.  HENT: Negative for ear pain and sore throat.   Eyes: Negative for pain and visual disturbance.  Respiratory: Positive for cough, chest tightness, shortness of breath and wheezing.   Cardiovascular: Negative for chest pain, palpitations and claudication.  Gastrointestinal: Negative for abdominal pain and vomiting.  Genitourinary: Negative for dysuria and hematuria.  Musculoskeletal: Negative for arthralgias and back pain.  Skin: Negative for color change and rash.  Neurological: Negative for seizures and syncope.  All other systems reviewed and are negative.   Physical Exam Updated Vital Signs  ED Triage Vitals  Enc Vitals Group     BP 10/26/19 0915 120/78     Pulse Rate 10/26/19 0915 (!) 103     Resp 10/26/19 0915 18     Temp 10/26/19  0915 98 F (36.7 C)     Temp Source 10/26/19 0915 Oral     SpO2 10/26/19 0915 98 %     Weight 10/26/19 0937 226 lb (102.5 kg)     Height 10/26/19 0937 5' (1.524 m)     Head Circumference --      Peak Flow --      Pain Score 10/26/19 0938 7     Pain Loc --      Pain Edu? --      Excl. in [place]? --     Physical Exam Vitals and nursing note reviewed.  Constitutional:      General: She is not in acute distress.    Appearance: She is well-developed. She is not ill-appearing.  HENT:     Head: Normocephalic and atraumatic.     Mouth/Throat:     Mouth: Mucous membranes are moist.  Eyes:     Conjunctiva/sclera: Conjunctivae normal.     Pupils: Pupils are equal, round, and reactive to light.  Cardiovascular:     Rate and Rhythm: Normal rate and regular rhythm.     Pulses: Normal pulses.     Heart sounds: Normal heart sounds. No murmur heard.   Pulmonary:      Effort: Pulmonary effort is normal. No tachypnea or respiratory distress.     Breath sounds: Examination of the right-upper field reveals wheezing. Examination of the left-upper field reveals wheezing. Wheezing present. No rhonchi or rales.  Abdominal:     Palpations: Abdomen is soft.     Tenderness: There is no abdominal tenderness.  Musculoskeletal:     Cervical back: Neck supple.     Right lower leg: No edema.     Left lower leg: No edema.  Skin:    General: Skin is warm and dry.     Capillary Refill: Capillary refill takes less than 2 seconds.  Neurological:     Mental Status: She is alert.     ED Results / Procedures / Treatments   Labs (all labs ordered are listed, but only abnormal results are displayed) Labs Reviewed  SARS CORONAVIRUS 2 BY RT PCR (HOSPITAL ORDER, PERFORMED IN [facility] LAB)    EKG None  Radiology No results found.  Procedures Procedures (including critical care time)  Medications Ordered in ED Medications  albuterol (VENTOLIN HFA) 108 (90 Base) MCG/ACT inhaler 4 puff (has no administration in time range)  predniSONE (DELTASONE) tablet 60 mg (has no administration in time range)    ED Course  I have reviewed the triage vital signs and the nursing notes.  Pertinent labs & imaging results that were available during my care of the patient were reviewed by me and considered in my medical decision making (see chart for details).    MDM Rules/Calculators/A&P                          Bethany Bray is a 29 year old female with history of asthma who presents to the ED with cough congestion shortness of breath.  Using her inhaler with some relief.  Patient overall no distress.  Normal vitals.  No fever.  Not vaccinated for coronavirus.  Had exposure to possibly some with coronavirus last week but had a negative test then.  Will recheck.  Has some scattered wheezing in upper lung fields.  Had just taken 2 puffs of albuterol prior to my  evaluation.  Will give some additional albuterol and send her home  with new inhaler.  Patient has no DVT or PE risk factors.  She is on progesterone birth control.  She has no concern for pregnancy.  Will give steroid burst.  Overall likely asthma exacerbation.  Appears comfortable.  Has follow-up with primary care actually ready in place.  Given return precautions.  No need for further pulmonary cardiac work-up at this time.  This chart was dictated using voice recognition software.  Despite best efforts to proofread,  errors can occur which can change the documentation meaning.  Bethany Bray was evaluated in Emergency Department on 10/26/2019 for the symptoms described in the history of present illness. She was evaluated in the context of the global COVID-19 pandemic, which necessitated consideration that the patient might be at risk for infection with the SARS-CoV-2 virus that causes COVID-19. Institutional protocols and algorithms that pertain to the evaluation of patients at risk for COVID-19 are in a state of rapid change based on information released by regulatory bodies including the CDC and federal and state organizations. These policies and algorithms were followed during the patient's care in the ED.    Final Clinical Impression(s) / ED Diagnoses Final diagnoses:  Mild asthma with exacerbation, unspecified whether persistent    Rx / DC Orders ED Discharge Orders         Ordered    predniSONE (DELTASONE) 20 MG tablet  Daily     Discontinue  Reprint     10/26/19 1034           Virgina Norfolk, DO 10/26/19 1034

## 2019-10-26 NOTE — ED Triage Notes (Signed)
Patient complains of congestion and asthma exacerbation since am. Using inhaler with no relief. Alert and oriented, speaking complete sentences.

## 2020-01-18 IMAGING — CT CT ABD-PELV W/ CM
2 of 4 series · 16 of 46 positions shown, 18 images · IV contrast (APPLIED)
Comparison: None.

CLINICAL DATA: RIGHT hip pain.  Pain with movement.

EXAM:
CT ABDOMEN AND PELVIS WITH CONTRAST
TECHNIQUE: Multidetector CT imaging of the abdomen and pelvis was performed
using the standard protocol following bolus administration of
intravenous contrast.
CONTRAST:  100mL OMNIPAQUE IOHEXOL 300 MG/ML  SOLN

[Series 2: routine abd/pel with · axial · 0.65mm/px · z∈[-451,-41]mm · 13 of 90 slices shown, 15 images]
[im 4/90  soft-tissue]
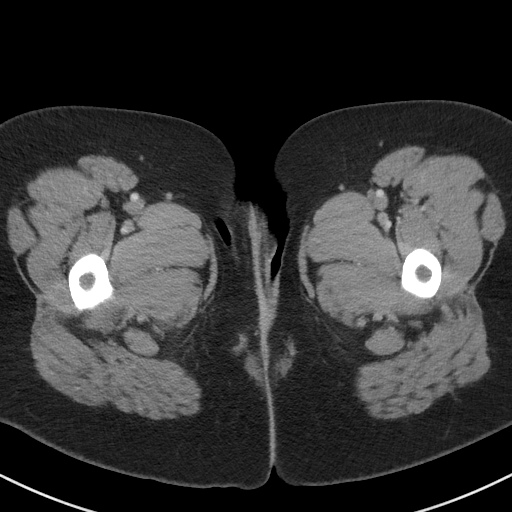
[im 4/90  bone]
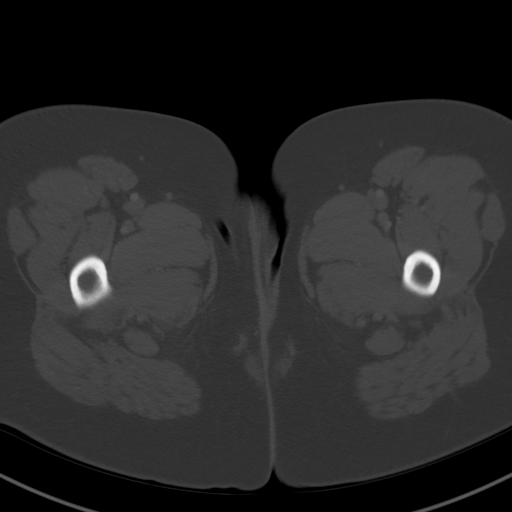
[im 12/90  soft-tissue]
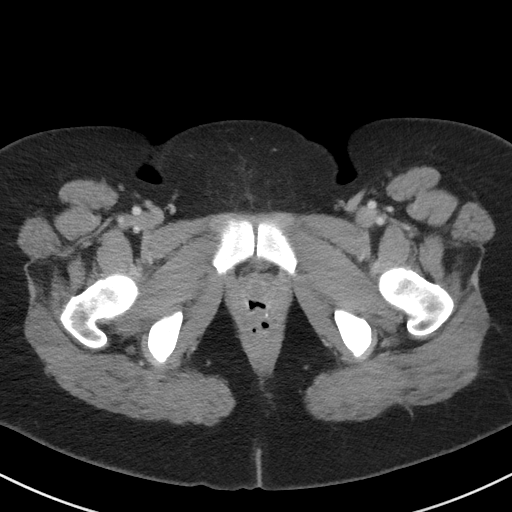
[im 19/90  soft-tissue]
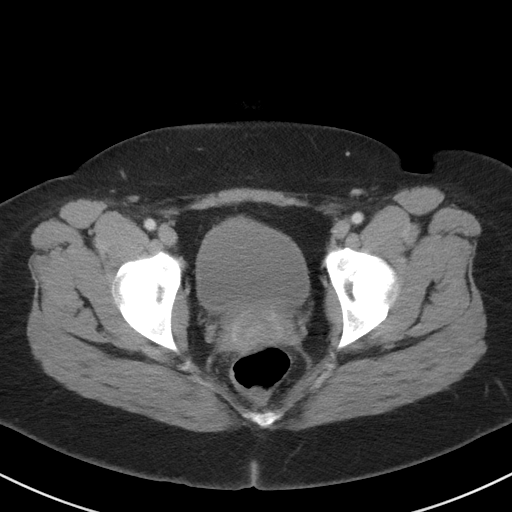
[im 26/90  soft-tissue]
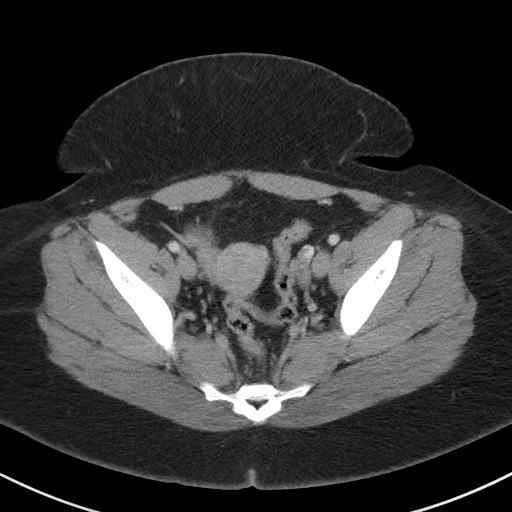
[im 30/90  soft-tissue]
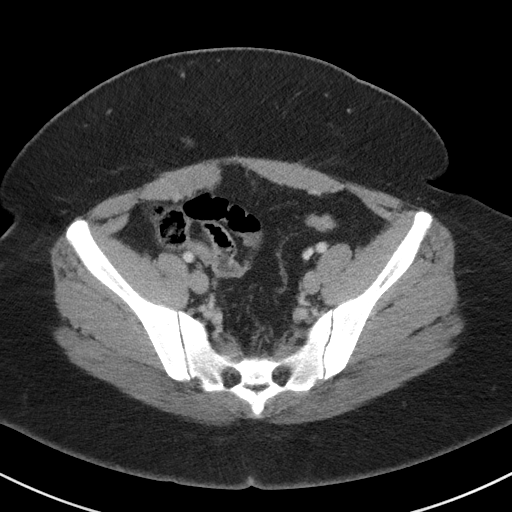
[im 38/90  soft-tissue]
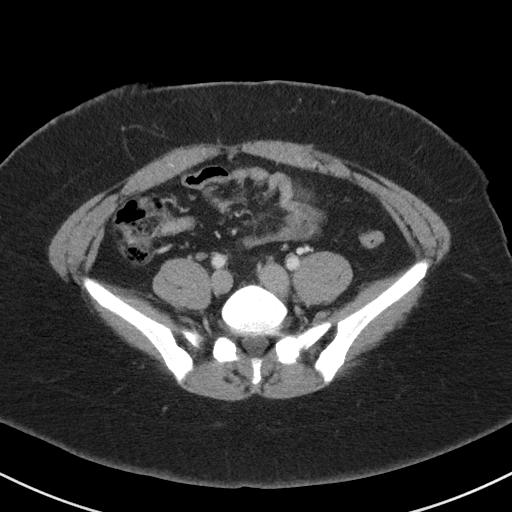
[im 45/90  soft-tissue]
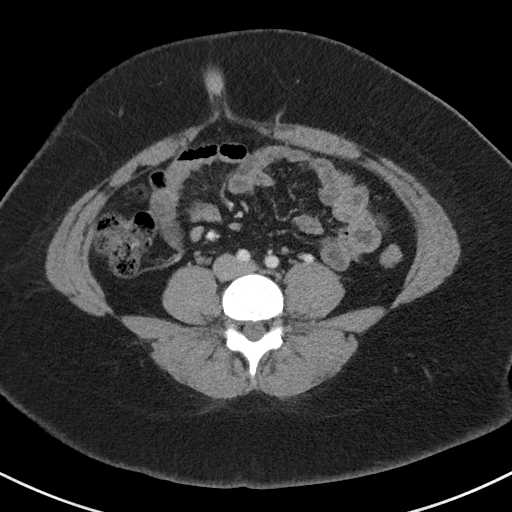
[im 52/90  soft-tissue]
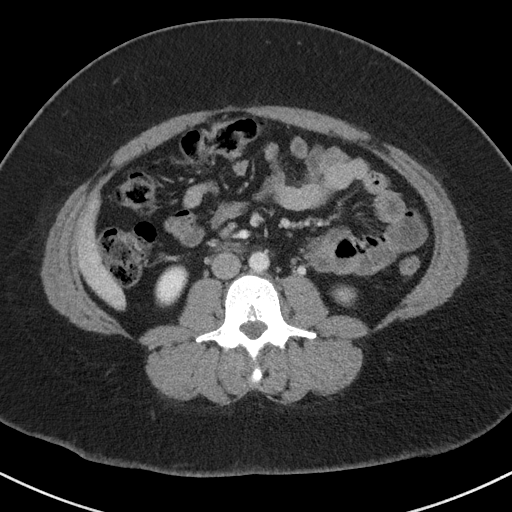
[im 60/90  soft-tissue]
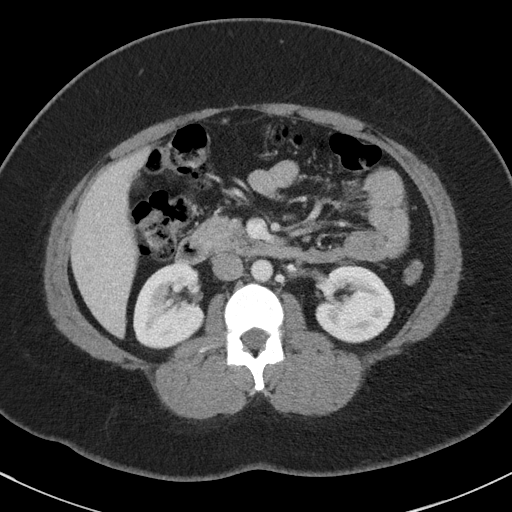
[im 60/90  bone]
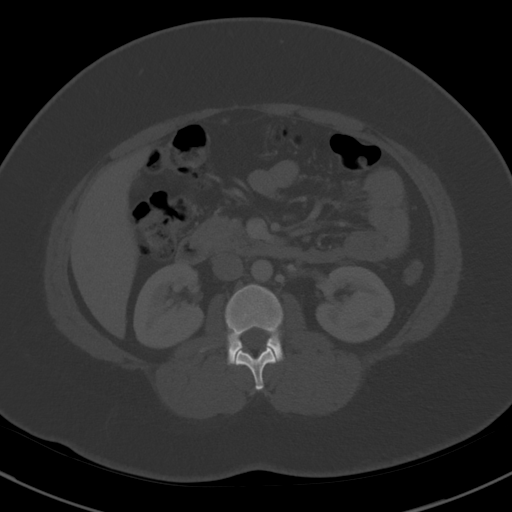
[im 64/90  soft-tissue]
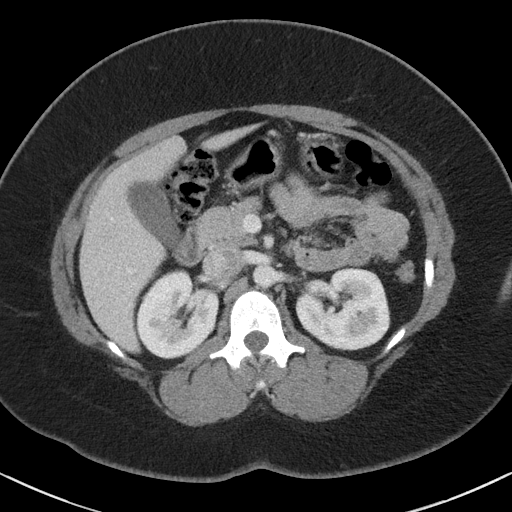
[im 71/90  soft-tissue]
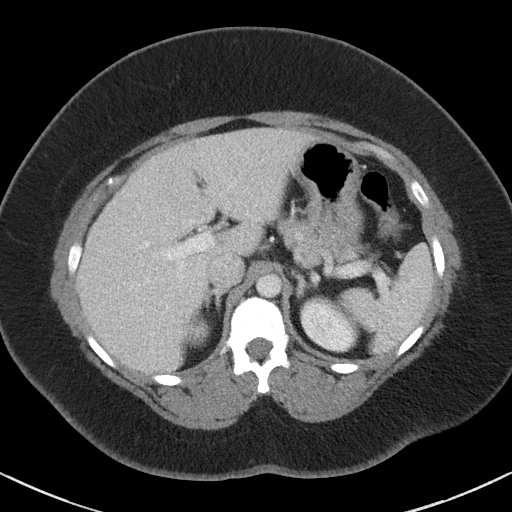
[im 78/90  soft-tissue]
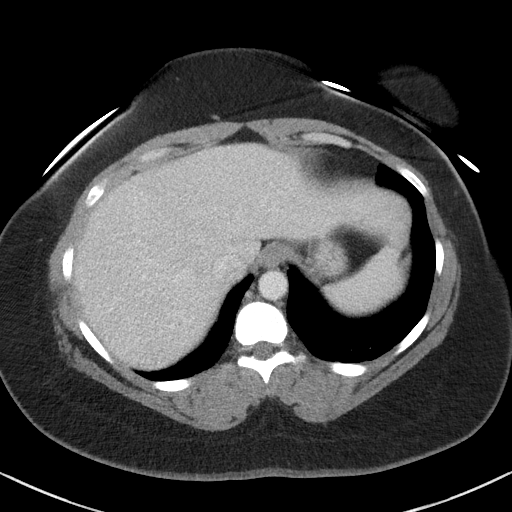
[im 86/90  soft-tissue]
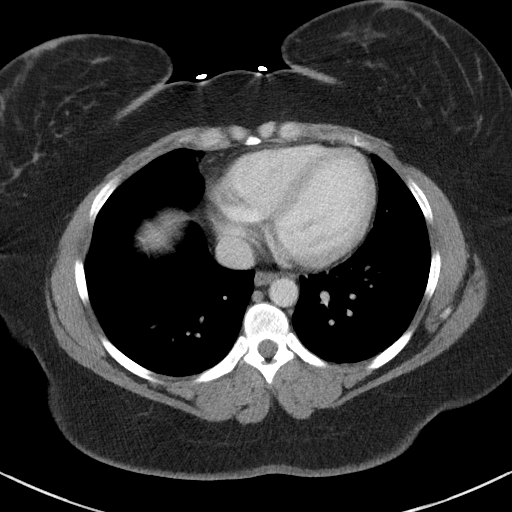

[Series 5: coronal st · coronal · 0.69mm/px · 3 of 82 slices shown]
[im 28/82  soft-tissue]
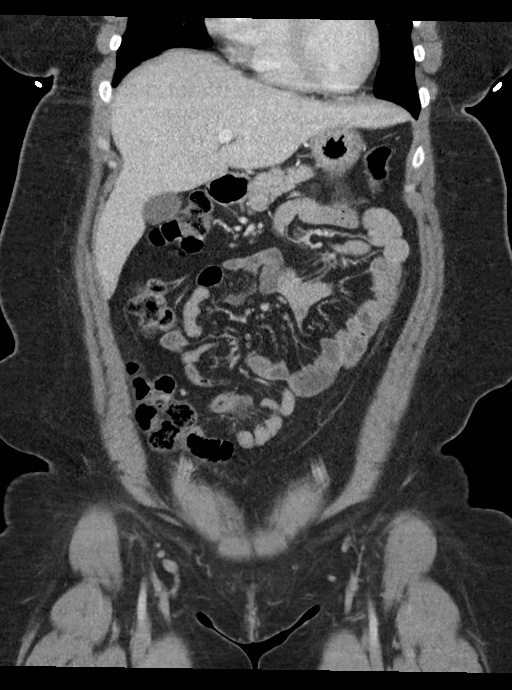
[im 37/82  soft-tissue]
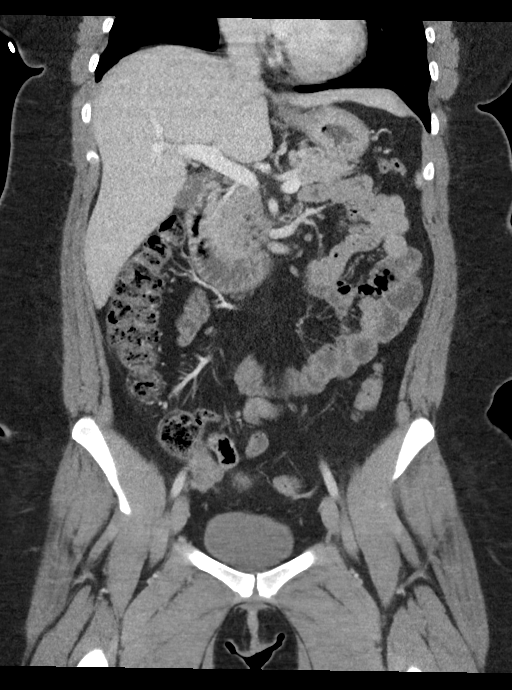
[im 46/82  soft-tissue]
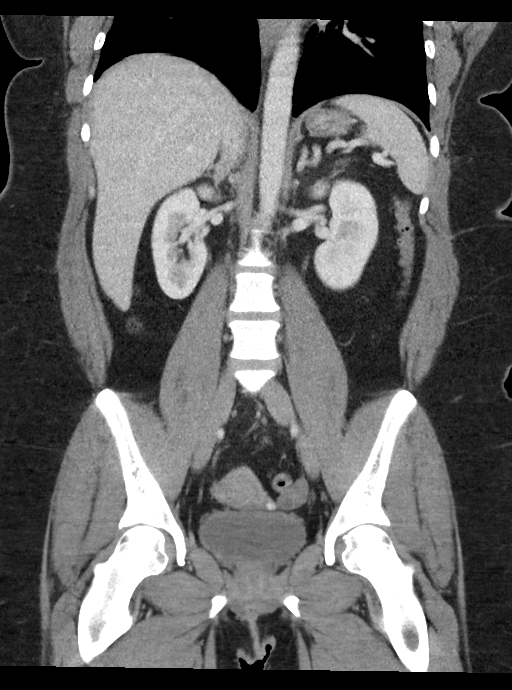

[16 of 46 positions shown; findings below may reference images not displayed]

FINDINGS: Lower chest: Lung bases are clear.

Hepatobiliary: No focal hepatic lesion. No biliary duct dilatation.
Gallbladder is normal. Common bile duct is normal.

Pancreas: Pancreas is normal. No ductal dilatation. No pancreatic
inflammation.

Spleen: Normal spleen

Adrenals/urinary tract: Adrenal glands and kidneys are normal. The
ureters and bladder normal.

Stomach/Bowel: Stomach, small bowel, appendix, and cecum are normal.
The colon and rectosigmoid colon are normal.

Vascular/Lymphatic: Abdominal aorta is normal caliber. No periportal
or retroperitoneal adenopathy. No pelvic adenopathy.

Reproductive: Uterus and ovaries normal.

Other: No free fluid.

Musculoskeletal: . No significant abnormality of the LEFT RIGHT hip
joint.
IMPRESSION: 1. No acute findings in the abdomen pelvis.
2. No abnormality of the RIGHT hip.

## 2020-05-04 ENCOUNTER — Encounter: Payer: Self-pay | Admitting: Emergency Medicine

## 2020-05-04 ENCOUNTER — Emergency Department: Payer: BC Managed Care – PPO

## 2020-05-04 ENCOUNTER — Emergency Department
Admission: EM | Admit: 2020-05-04 | Discharge: 2020-05-04 | Disposition: A | Discharge status: Home / Self Care | Payer: BC Managed Care – PPO | Attending: Emergency Medicine | Admitting: Emergency Medicine

## 2020-05-04 ENCOUNTER — Other Ambulatory Visit: Payer: Self-pay

## 2020-05-04 DIAGNOSIS — J181 Lobar pneumonia, unspecified organism: Secondary | ICD-10-CM | POA: Insufficient documentation

## 2020-05-04 DIAGNOSIS — J189 Pneumonia, unspecified organism: Secondary | ICD-10-CM

## 2020-05-04 DIAGNOSIS — J45909 Unspecified asthma, uncomplicated: Secondary | ICD-10-CM | POA: Insufficient documentation

## 2020-05-04 DIAGNOSIS — Z20822 Contact with and (suspected) exposure to covid-19: Secondary | ICD-10-CM | POA: Insufficient documentation

## 2020-05-04 DIAGNOSIS — E119 Type 2 diabetes mellitus without complications: Secondary | ICD-10-CM | POA: Diagnosis not present

## 2020-05-04 DIAGNOSIS — Z7951 Long term (current) use of inhaled steroids: Secondary | ICD-10-CM | POA: Insufficient documentation

## 2020-05-04 DIAGNOSIS — R0602 Shortness of breath: Secondary | ICD-10-CM | POA: Diagnosis present

## 2020-05-04 HISTORY — DX: Type 2 diabetes mellitus without complications: E11.9

## 2020-05-04 LAB — BASIC METABOLIC PANEL
Anion gap: 10 (ref 5–15)
BUN: 11 mg/dL (ref 6–20)
CO2: 24 mmol/L (ref 22–32)
Calcium: 9.4 mg/dL (ref 8.9–10.3)
Chloride: 99 mmol/L (ref 98–111)
Creatinine, Ser: 0.61 mg/dL (ref 0.44–1.00)
GFR, Estimated: >60 mL/min (ref >60)
Glucose, Bld: 123 mg/dL — ABNORMAL HIGH (ref 70–99)
Potassium: 4.1 mmol/L (ref 3.5–5.1)
Sodium: 133 mmol/L — ABNORMAL LOW (ref 135–145)

## 2020-05-04 LAB — TROPONIN I (HIGH SENSITIVITY)
Troponin I (High Sensitivity): <2 ng/L (ref <18)
Troponin I (High Sensitivity): <2 ng/L (ref <18)

## 2020-05-04 LAB — CBC
HCT: 40.1 % (ref 36.0–46.0)
Hemoglobin: 12.9 g/dL (ref 12.0–15.0)
MCH: 26.4 pg (ref 26.0–34.0)
MCHC: 32.2 g/dL (ref 30.0–36.0)
MCV: 82 fL (ref 80.0–100.0)
Platelets: 241 10*3/uL (ref 150–400)
RBC: 4.89 MIL/uL (ref 3.87–5.11)
RDW: 13 % (ref 11.5–15.5)
WBC: 9 10*3/uL (ref 4.0–10.5)
nRBC: 0.3 % — ABNORMAL HIGH (ref 0.0–0.2)

## 2020-05-04 LAB — SARS CORONAVIRUS 2 (TAT 6-24 HRS): SARS Coronavirus 2: NEGATIVE

## 2020-05-04 MED ORDER — AZITHROMYCIN 250 MG PO TABS: ORAL_TABLET | ORAL | 0 refills | Status: AC

## 2020-05-04 MED ORDER — CEFDINIR 300 MG PO CAPS: 300.0000 mg | ORAL_CAPSULE | Freq: Two times a day (BID) | ORAL | 0 refills | Status: AC

## 2020-05-04 NOTE — ED Triage Notes (Signed)
Pt to ED via POV with c/o SOB and cough, pt states coughed this morning at approx 0300 and noted streaks of blood mixed in with sputum. Pt states had negative covid test yesterday with [facility].A[facility]. Pt A&O x4, able to speak in complete sentences at this time.   Pt c/o chest tightness with ambulation

## 2020-05-04 NOTE — ED Provider Notes (Signed)
[facility] Emergency Department Provider Note   ____________________________________________    I have reviewed the triage vital signs and the nursing notes.   HISTORY  Chief Complaint Hemoptysis and Shortness of Breath     HPI Bethany Bray is a 30 y.o. female with a history of asthma, diabetes who presents with complaints of mild shortness of breath, cough, fatigue, headaches.  Had negative rapid Covid yesterday.  Continues to feel ill today.  Has not take anything for this.  Coughed up yellow sputum with a streak of blood.  Past Medical History:  Diagnosis Date  . Anemia   . Asthma   . Diabetes mellitus without complication (HCC)     There are no problems to display for this patient.   Past Surgical History:  Procedure Laterality Date  . Birth Control Implant Removal    . CESAREAN SECTION      Prior to Admission medications   Medication Sig Start Date End Date Taking? Authorizing Provider  azithromycin (ZITHROMAX Z-PAK) 250 MG tablet Take 2 tablets (500 mg) on  Day 1,  followed by 1 tablet (250 mg) once daily on Days 2 through 5. 05/04/20 05/09/20 Yes Jene Every, MD  cefdinir (OMNICEF) 300 MG capsule Take 1 capsule (300 mg total) by mouth 2 (two) times daily for 7 days. 05/04/20 05/11/20 Yes Jene Every, MD  albuterol (VENTOLIN HFA) 108 (90 Base) MCG/ACT inhaler Inhale 2 puffs into the lungs every 4 (four) hours as needed for wheezing or shortness of breath. 05/03/19   Triplett, Cari B, FNP  Ipratropium-Albuterol (COMBIVENT) 20-100 MCG/ACT AERS respimat Inhale 1 puff into the lungs every 6 (six) hours. 05/03/19   Chinita Pester, FNP     Allergies Patient has no known allergies.  No family history on file.  Social History Social History   Tobacco Use  . Smoking status: Never Smoker  . Smokeless tobacco: Never Used  Vaping Use  . Vaping Use: Never used  Substance Use Topics  . Alcohol use: Yes    Comment: Occ  . Drug use:  No    Review of Systems  Constitutional: Fatigue  ENT: No sore throat.   Gastrointestinal: No abdominal pain.  No nausea, no vomiting.   Genitourinary: Negative for dysuria. Musculoskeletal: Myalgias Skin: Negative for rash. Neurological: Headache    ____________________________________________   PHYSICAL EXAM:  VITAL SIGNS: ED Triage Vitals  Enc Vitals Group     BP 05/04/20 0830 118/61     Pulse Rate 05/04/20 0830 96     Resp 05/04/20 0830 20     Temp 05/04/20 0830 98.4 F (36.9 C)     Temp Source 05/04/20 0830 Oral     SpO2 05/04/20 0830 99 %     Weight 05/04/20 0830 99.8 kg (220 lb)     Height 05/04/20 0830 1.524 m (5')     Head Circumference --      Peak Flow --      Pain Score 05/04/20 0836 0     Pain Loc --      Pain Edu? --      Excl. in [place]? --      Constitutional: Alert and oriented. No acute distress. Pleasant and interactive  Nose: No congestion/rhinnorhea. Mouth/Throat: Mucous membranes are moist.   Cardiovascular: Normal rate, regular rhythm.  Respiratory: Normal respiratory effort.  No retractions. Genitourinary: deferred Musculoskeletal: No lower extremity tenderness nor edema.   Neurologic:  Normal speech and language. No gross focal  neurologic deficits are appreciated.   Skin:  Skin is warm, dry and intact. No rash noted.   ____________________________________________   LABS (all labs ordered are listed, but only abnormal results are displayed)  Labs Reviewed  BASIC METABOLIC PANEL - Abnormal; Notable for the following components:      Result Value   Sodium 133 (*)    Glucose, Bld 123 (*)    All other components within normal limits  CBC - Abnormal; Notable for the following components:   nRBC 0.3 (*)    All other components within normal limits  SARS CORONAVIRUS 2 (TAT 6-24 HRS)  POC URINE PREG, ED  TROPONIN I (HIGH SENSITIVITY)  TROPONIN I (HIGH SENSITIVITY)    ____________________________________________  EKG   ____________________________________________  RADIOLOGY  Chest x-ray viewed by me, possible opacity ____________________________________________   PROCEDURES  Procedure(s) performed: No  Procedures   Critical Care performed: No ____________________________________________   INITIAL IMPRESSION / ASSESSMENT AND PLAN / ED COURSE  Pertinent labs & imaging results that were available during my care of the patient were reviewed by me and considered in my medical decision making (see chart for details).  Patient presents with cough, fatigue, perihilar opacity on chest x-ray.  Lab work is reassuring, symptoms are suspicious for Covid  We will send Covid swab and start the patient on antibiotic therapy, instructions to stop antibiotics if positive Covid.  Return precautions of any worsening   ____________________________________________   FINAL CLINICAL IMPRESSION(S) / ED DIAGNOSES  Final diagnoses:  Community acquired pneumonia of right middle lobe of lung  Suspected COVID-19 virus infection      NEW MEDICATIONS STARTED DURING THIS VISIT:  Discharge Medication List as of 05/04/2020 11:34 AM    START taking these medications   Details  azithromycin (ZITHROMAX Z-PAK) 250 MG tablet Take 2 tablets (500 mg) on  Day 1,  followed by 1 tablet (250 mg) once daily on Days 2 through 5., Normal    cefdinir (OMNICEF) 300 MG capsule Take 1 capsule (300 mg total) by mouth 2 (two) times daily for 7 days., Starting Thu 05/04/2020, Until Thu 05/11/2020, Normal         Note:  This document was prepared using Dragon voice recognition software and may include unintentional dictation errors.   Jene Every, MD 05/04/20 [phone]

## 2021-02-01 ENCOUNTER — Emergency Department
Admission: EM | Admit: 2021-02-01 | Discharge: 2021-02-01 | Disposition: A | Discharge status: Home / Self Care | Payer: BC Managed Care – PPO | Attending: Emergency Medicine | Admitting: Emergency Medicine

## 2021-02-01 ENCOUNTER — Other Ambulatory Visit: Payer: Self-pay

## 2021-02-01 DIAGNOSIS — E119 Type 2 diabetes mellitus without complications: Secondary | ICD-10-CM | POA: Insufficient documentation

## 2021-02-01 DIAGNOSIS — U071 COVID-19: Secondary | ICD-10-CM | POA: Insufficient documentation

## 2021-02-01 DIAGNOSIS — J45909 Unspecified asthma, uncomplicated: Secondary | ICD-10-CM | POA: Insufficient documentation

## 2021-02-01 DIAGNOSIS — R112 Nausea with vomiting, unspecified: Secondary | ICD-10-CM | POA: Insufficient documentation

## 2021-02-01 DIAGNOSIS — Z7951 Long term (current) use of inhaled steroids: Secondary | ICD-10-CM | POA: Insufficient documentation

## 2021-02-01 LAB — URINALYSIS, COMPLETE (UACMP) WITH MICROSCOPIC
Bilirubin Urine: NEGATIVE
Glucose, UA: NEGATIVE mg/dL
Hgb urine dipstick: NEGATIVE
Ketones, ur: NEGATIVE mg/dL
Leukocytes,Ua: NEGATIVE
Nitrite: NEGATIVE
Protein, ur: NEGATIVE mg/dL
Specific Gravity, Urine: 1.021 (ref 1.005–1.030)
pH: 7 (ref 5.0–8.0)

## 2021-02-01 LAB — POC URINE PREG, ED: Preg Test, Ur: NEGATIVE

## 2021-02-01 LAB — COMPREHENSIVE METABOLIC PANEL
ALT: 10 U/L (ref 0–44)
AST: 13 U/L — ABNORMAL LOW (ref 15–41)
Albumin: 3.8 g/dL (ref 3.5–5.0)
Alkaline Phosphatase: 43 U/L (ref 38–126)
Anion gap: 9 (ref 5–15)
BUN: 17 mg/dL (ref 6–20)
CO2: 22 mmol/L (ref 22–32)
Calcium: 8.9 mg/dL (ref 8.9–10.3)
Chloride: 106 mmol/L (ref 98–111)
Creatinine, Ser: 0.71 mg/dL (ref 0.44–1.00)
GFR, Estimated: >60 mL/min (ref >60)
Glucose, Bld: 96 mg/dL (ref 70–99)
Potassium: 4.1 mmol/L (ref 3.5–5.1)
Sodium: 137 mmol/L (ref 135–145)
Total Bilirubin: 0.6 mg/dL (ref 0.3–1.2)
Total Protein: 7.1 g/dL (ref 6.5–8.1)

## 2021-02-01 LAB — RESP PANEL BY RT-PCR (FLU A&B, COVID) ARPGX2
Influenza A by PCR: NEGATIVE
Influenza B by PCR: NEGATIVE
SARS Coronavirus 2 by RT PCR: POSITIVE — AB

## 2021-02-01 LAB — CBC
HCT: 39.3 % (ref 36.0–46.0)
Hemoglobin: 12.8 g/dL (ref 12.0–15.0)
MCH: 27.3 pg (ref 26.0–34.0)
MCHC: 32.6 g/dL (ref 30.0–36.0)
MCV: 83.8 fL (ref 80.0–100.0)
Platelets: 286 10*3/uL (ref 150–400)
RBC: 4.69 MIL/uL (ref 3.87–5.11)
RDW: 13.5 % (ref 11.5–15.5)
WBC: 10.4 10*3/uL (ref 4.0–10.5)
nRBC: 0 % (ref 0.0–0.2)

## 2021-02-01 LAB — LIPASE, BLOOD: Lipase: 33 U/L (ref 11–51)

## 2021-02-01 MED ORDER — ONDANSETRON 4 MG PO TBDP: 4.0000 mg | ORAL_TABLET | Freq: Three times a day (TID) | ORAL | 0 refills | Status: DC | PRN

## 2021-02-01 MED ORDER — ONDANSETRON 4 MG PO TBDP
4.0000 mg | ORAL_TABLET | Freq: Once | ORAL | Status: AC
  Administered 2021-02-01: 4 mg via ORAL
  Filled 2021-02-01: qty 1

## 2021-02-01 NOTE — ED Provider Notes (Signed)
[facility] Emergency Department Provider Note  Time seen: 8:46 AM  I have reviewed the triage vital signs and the nursing notes.   HISTORY  Chief Complaint Emesis and Diarrhea   HPI Bethany Bray is a 30 y.o. female with a past medical history of anemia, asthma, diabetes, presents to the emergency department for nausea vomiting diarrhea.  According to the patient over the past 1 to 2 weeks or so she has been experiencing intermittent episodes of nausea vomiting and diarrhea.  Denies any abdominal pain.  Denies any fever.  Does state over the weekend she was also exposed to a friend with COVID.  Patient states she has noted a slight dry cough at times.   Past Medical History:  Diagnosis Date   Anemia    Asthma    Diabetes mellitus without complication (HCC)     There are no problems to display for this patient.   Past Surgical History:  Procedure Laterality Date   Birth Control Implant Removal     CESAREAN SECTION      Prior to Admission medications   Medication Sig Start Date End Date Taking? Authorizing Provider  albuterol (VENTOLIN HFA) 108 (90 Base) MCG/ACT inhaler Inhale 2 puffs into the lungs every 4 (four) hours as needed for wheezing or shortness of breath. 05/03/19   Triplett, Cari B, FNP  Ipratropium-Albuterol (COMBIVENT) 20-100 MCG/ACT AERS respimat Inhale 1 puff into the lungs every 6 (six) hours. 05/03/19   Triplett, Rulon Eisenmenger B, FNP    No Known Allergies  No family history on file.  Social History Social History   Tobacco Use   Smoking status: Never   Smokeless tobacco: Never  Vaping Use   Vaping Use: Never used  Substance Use Topics   Alcohol use: Yes    Comment: Occ   Drug use: No    Review of Systems Constitutional: Negative for fever. Cardiovascular: Negative for chest pain. Respiratory: Negative for shortness of breath.  Occasional dry cough. Gastrointestinal: Negative for abdominal pain.  Positive for nausea vomiting  and diarrhea. Genitourinary: Last menstrual period 4 weeks ago. Musculoskeletal: Negative for musculoskeletal complaints Neurological: Negative for headache All other ROS negative  ____________________________________________   PHYSICAL EXAM:  VITAL SIGNS: ED Triage Vitals  Enc Vitals Group     BP 02/01/21 0821 135/87     Pulse Rate 02/01/21 0821 74     Resp 02/01/21 0821 18     Temp 02/01/21 0821 98.3 F (36.8 C)     Temp Source 02/01/21 0821 Oral     SpO2 02/01/21 0821 99 %     Weight 02/01/21 0822 209 lb (94.8 kg)     Height 02/01/21 0822 5' (1.524 m)     Head Circumference --      Peak Flow --      Pain Score 02/01/21 0821 0     Pain Loc --      Pain Edu? --      Excl. in GC? --    Constitutional: Alert and oriented. Well appearing and in no distress. Eyes: Normal exam ENT      Head: Normocephalic and atraumatic.      Mouth/Throat: Mucous membranes are moist. Cardiovascular: Normal rate, regular rhythm.  Respiratory: Normal respiratory effort without tachypnea nor retractions. Breath sounds are clear  Gastrointestinal: Soft and nontender. No distention.   Musculoskeletal: Nontender with normal range of motion in all extremities.  Neurologic:  Normal speech and language. No gross focal neurologic deficits  Skin:  Skin is warm, dry and intact.  Psychiatric: Mood and affect are normal.  ____________________________________________   INITIAL IMPRESSION / ASSESSMENT AND PLAN / ED COURSE  Pertinent labs & imaging results that were available during my care of the patient were reviewed by me and considered in my medical decision making (see chart for details).   Patient presents emergency department for intermittent nausea vomiting diarrhea over the past 2 weeks.  Overall the patient appears well, no distress.  Reassuring vitals.  Reassuring physical exam.  Benign abdomen.  We will check labs, dose ODT Zofran.  Patient wishes to avoid an IV if possible.  Patient also  states she would like a COVID swab as she was exposed to COVID over the weekend.  Patient has tested positive for COVID.  Given the patient's symptoms have been ongoing for at least a week likely 2 weeks she is outside of the window for antivirals.  Discussed supportive care at home.  We will discharge with Zofran to be used as needed for nausea, over-the-counter Imodium for diarrhea plenty of rest and fluids.  Discussed return precautions.  Patient agreeable to plan of care.  Bethany Bray was evaluated in Emergency Department on 02/01/2021 for the symptoms described in the history of present illness. She was evaluated in the context of the global COVID-19 pandemic, which necessitated consideration that the patient might be at risk for infection with the SARS-CoV-2 virus that causes COVID-19. Institutional protocols and algorithms that pertain to the evaluation of patients at risk for COVID-19 are in a state of rapid change based on information released by regulatory bodies including the CDC and federal and state organizations. These policies and algorithms were followed during the patient's care in the ED.  ____________________________________________   FINAL CLINICAL IMPRESSION(S) / ED DIAGNOSES  Nausea vomiting diarrhea COVID-19    Minna Antis, MD 02/01/21 1019

## 2021-02-01 NOTE — ED Triage Notes (Signed)
Pt to ER via POV with complaints of emesis and diarrhea x2 weeks. Denies abdominal pain/ denies urinary symptoms.   Reports possible covid contact over the weekend. No known fevers at home.

## 2021-04-25 ENCOUNTER — Other Ambulatory Visit: Payer: Self-pay

## 2021-04-25 ENCOUNTER — Encounter: Payer: Self-pay | Admitting: Emergency Medicine

## 2021-04-25 ENCOUNTER — Emergency Department
Admission: EM | Admit: 2021-04-25 | Discharge: 2021-04-25 | Disposition: A | Discharge status: Home / Self Care | Payer: Self-pay | Attending: Emergency Medicine | Admitting: Emergency Medicine

## 2021-04-25 DIAGNOSIS — J45909 Unspecified asthma, uncomplicated: Secondary | ICD-10-CM | POA: Insufficient documentation

## 2021-04-25 DIAGNOSIS — J988 Other specified respiratory disorders: Secondary | ICD-10-CM

## 2021-04-25 DIAGNOSIS — Z20822 Contact with and (suspected) exposure to covid-19: Secondary | ICD-10-CM | POA: Insufficient documentation

## 2021-04-25 DIAGNOSIS — E119 Type 2 diabetes mellitus without complications: Secondary | ICD-10-CM | POA: Insufficient documentation

## 2021-04-25 DIAGNOSIS — J069 Acute upper respiratory infection, unspecified: Secondary | ICD-10-CM | POA: Insufficient documentation

## 2021-04-25 LAB — RESP PANEL BY RT-PCR (FLU A&B, COVID) ARPGX2
Influenza A by PCR: NEGATIVE
Influenza B by PCR: NEGATIVE
SARS Coronavirus 2 by RT PCR: NEGATIVE

## 2021-04-25 LAB — GROUP A STREP BY PCR: Group A Strep by PCR: NOT DETECTED

## 2021-04-25 MED ORDER — DEXAMETHASONE 1 MG/ML PO CONC
10.0000 mg | Freq: Once | ORAL | Status: AC
  Administered 2021-04-25: 21:00:00 10 mg via ORAL
  Filled 2021-04-25: qty 10

## 2021-04-25 NOTE — Discharge Instructions (Addendum)
-  Please return to the emergency department if you begin to experience any new or worsening symptoms.

## 2021-04-25 NOTE — ED Triage Notes (Signed)
C/O productive cough x 3-4 days and chills this morning.

## 2021-04-25 NOTE — ED Provider Notes (Signed)
Bethany Bray - [facility] Emergency Department Provider Note    ____________________________________________   Event Date/Time   First MD Initiated Contact with Patient 04/25/21 1855     (approximate)  I have reviewed the triage vital signs and the nursing notes.   HISTORY  Chief Complaint No chief complaint on file.    HPI Bethany Bray is a 30 y.o. female, history of asthma and diabetes, presents the emergency department for evaluation of cough, chills, sore throat, and shortness of breath. Patient states the symptoms been going on for 4 days and have failed to improve.  No known sick contacts.  Denies chest pain, abdominal pain, back pain, headaches, or urinary symptoms.  History limited by: No limitations.  Past Medical History:  Diagnosis Date   Anemia    Asthma    Diabetes mellitus without complication ([place])     There are no problems to display for this patient.   Past Surgical History:  Procedure Laterality Date   Birth Control Implant Removal     CESAREAN SECTION      Prior to Admission medications   Medication Sig Start Date End Date Taking? Authorizing Provider  albuterol (VENTOLIN HFA) 108 (90 Base) MCG/ACT inhaler Inhale 2 puffs into the lungs every 4 (four) hours as needed for wheezing or shortness of breath. 05/03/19   Triplett, Cari B, FNP  Ipratropium-Albuterol (COMBIVENT) 20-100 MCG/ACT AERS respimat Inhale 1 puff into the lungs every 6 (six) hours. 05/03/19   Triplett, Cari B, FNP  ondansetron (ZOFRAN ODT) 4 MG disintegrating tablet Take 1 tablet (4 mg total) by mouth every 8 (eight) hours as needed for nausea or vomiting. 02/01/21   Harvest Dark, MD    Allergies Patient has no known allergies.  No family history on file.  Social History Social History   Tobacco Use   Smoking status: Never   Smokeless tobacco: Never  Vaping Use   Vaping Use: Never used  Substance Use Topics   Alcohol use: Yes    Comment: Occ   Drug use: No     Review of Systems Review of Systems  Constitutional:  Positive for chills. Negative for fever.  HENT:  Positive for sore throat. Negative for ear pain.   Eyes:  Negative for blurred vision.  Respiratory:  Positive for cough and shortness of breath. Negative for sputum production.   Cardiovascular:  Negative for chest pain and leg swelling.  Gastrointestinal:  Negative for abdominal pain and vomiting.  Genitourinary:  Negative for dysuria, flank pain and hematuria.  Musculoskeletal:  Negative for myalgias.  Skin:  Negative for rash.  Neurological:  Negative for headaches.    10-point ROS otherwise negative. ____________________________________________   PHYSICAL EXAM:  VITAL SIGNS: ED Triage Vitals  Enc Vitals Group     BP 04/25/21 1815 122/83     Pulse Rate 04/25/21 1815 92     Resp 04/25/21 1815 18     Temp 04/25/21 1815 98.2 F (36.8 C)     Temp Source 04/25/21 1815 Oral     SpO2 04/25/21 1815 100 %     Weight 04/25/21 1741 208 lb 15.9 oz (94.8 kg)     Height 04/25/21 1741 5' (1.524 m)     Head Circumference --      Peak Flow --      Pain Score 04/25/21 1741 5     Pain Loc --      Pain Edu? --      Excl. in [place]? --  Physical Exam Constitutional:      General: She is not in acute distress.    Appearance: Normal appearance. She is not ill-appearing.  HENT:     Head: Normocephalic.     Nose: Nose normal.     Mouth/Throat:     Mouth: Mucous membranes are moist.     Pharynx: Oropharynx is clear. Posterior oropharyngeal erythema present. No oropharyngeal exudate.     Comments: Notable tonsillar swelling. Eyes:     Conjunctiva/sclera: Conjunctivae normal.     Pupils: Pupils are equal, round, and reactive to light.  Cardiovascular:     Rate and Rhythm: Normal rate and regular rhythm.  Pulmonary:     Effort: Pulmonary effort is normal. No respiratory distress.     Breath sounds: Normal breath sounds. No wheezing, rhonchi or rales.  Abdominal:     General:  Abdomen is flat. There is no distension.     Palpations: Abdomen is soft.  Musculoskeletal:        General: Normal range of motion.     Cervical back: Normal range of motion and neck supple.  Skin:    General: Skin is warm and dry.  Neurological:     General: No focal deficit present.     Mental Status: She is alert. Mental status is at baseline.  Psychiatric:        Mood and Affect: Mood normal.        Behavior: Behavior normal.        Thought Content: Thought content normal.        Judgment: Judgment normal.     ____________________________________________    LABS  (all labs ordered are listed, but only abnormal results are displayed)  Labs Reviewed  RESP PANEL BY RT-PCR (FLU A&B, COVID) ARPGX2  GROUP A STREP BY PCR     ____________________________________________   EKG Not applicable.   ____________________________________________    RADIOLOGY I personally viewed and evaluated these images as part of my medical decision making, as well as reviewing the written report by the radiologist.  ED Provider Interpretation: Not applicable.  No results found.  ____________________________________________   PROCEDURES  Procedures   Medications  dexamethasone (DECADRON) 1 MG/ML solution 10 mg (10 mg Oral Given 04/25/21 2040)    Critical Care performed: No  ____________________________________________   INITIAL IMPRESSION / ASSESSMENT AND PLAN / ED COURSE  Pertinent labs & imaging results that were available during my care of the patient were reviewed by me and considered in my medical decision making (see chart for details).       Olam IdlerStephanie Bray is a 30 y.o. female, history of asthma and diabetes, presents the emergency department for evaluation of cough, chills, sore throat, and shortness of breath. Patient states the symptoms been going on for 4 days and have failed to improve.  No known sick contacts.  Denies chest pain, abdominal pain, back pain,  headaches, or urinary symptoms.  Patient appears well.  She is sitting upright comfortably in bed.  NAD.  Physical exam notable for erythema and tonsillar swelling in the throat.  No exudate.  Uvula midline.  Lung sounds are clear bilaterally.  She is afebrile.  Vital signs are within normal limits.  Respiratory panel negative for COVID or influenza.  Strep PCR negative.  I suspect that the patient likely has a viral respiratory infection.  No immediate complications suspected.  Low suspicion for life-threatening pathology.  Patient was provided with a one-time dose of dexamethasone here today.  We  will discharge this patient with anticipatory guidance, return precautions, and follow-up as needed.  Encouraged the patient to return to the emergency department at any time if she begins to experience any new or worsening symptoms.        ____________________________________________   FINAL CLINICAL IMPRESSION(S) / ED DIAGNOSES  Final diagnoses:  Viral respiratory infection     NEW MEDICATIONS STARTED DURING THIS VISIT:  ED Discharge Orders     None        Note:  This document was prepared using Dragon voice recognition software and may include unintentional dictation errors.    Varney Daily, [place] 04/25/21 2056    Delton [place], MD 04/26/21 [phone]

## 2021-04-29 ENCOUNTER — Encounter: Payer: Self-pay | Admitting: Emergency Medicine

## 2021-04-29 ENCOUNTER — Emergency Department: Payer: Self-pay

## 2021-04-29 ENCOUNTER — Other Ambulatory Visit: Payer: Self-pay

## 2021-04-29 DIAGNOSIS — Z8616 Personal history of COVID-19: Secondary | ICD-10-CM | POA: Insufficient documentation

## 2021-04-29 DIAGNOSIS — Z20822 Contact with and (suspected) exposure to covid-19: Secondary | ICD-10-CM | POA: Insufficient documentation

## 2021-04-29 DIAGNOSIS — J40 Bronchitis, not specified as acute or chronic: Secondary | ICD-10-CM | POA: Insufficient documentation

## 2021-04-29 DIAGNOSIS — J069 Acute upper respiratory infection, unspecified: Secondary | ICD-10-CM | POA: Insufficient documentation

## 2021-04-29 NOTE — ED Triage Notes (Signed)
Pt c/o cough, sore throat and nasal congestion since 12/25, was seen in ED on 12/28, diagnosed with bronchitis. Pt back to ED tonight due to continued cough without relief of medication prescribed as well as over use of albuterol, per pt.

## 2021-04-30 ENCOUNTER — Emergency Department
Admission: EM | Admit: 2021-04-30 | Discharge: 2021-04-30 | Disposition: A | Discharge status: Home / Self Care | Payer: Self-pay | Attending: Emergency Medicine | Admitting: Emergency Medicine

## 2021-04-30 DIAGNOSIS — J069 Acute upper respiratory infection, unspecified: Secondary | ICD-10-CM

## 2021-04-30 DIAGNOSIS — J40 Bronchitis, not specified as acute or chronic: Secondary | ICD-10-CM

## 2021-04-30 LAB — RESP PANEL BY RT-PCR (FLU A&B, COVID) ARPGX2
Influenza A by PCR: NEGATIVE
Influenza B by PCR: NEGATIVE
SARS Coronavirus 2 by RT PCR: NEGATIVE

## 2021-04-30 LAB — GROUP A STREP BY PCR: Group A Strep by PCR: NOT DETECTED

## 2021-04-30 MED ORDER — AZITHROMYCIN 250 MG PO TABS: ORAL_TABLET | ORAL | 0 refills | Status: DC

## 2021-04-30 MED ORDER — AZITHROMYCIN 500 MG PO TABS
500.0000 mg | ORAL_TABLET | Freq: Once | ORAL | Status: AC
  Administered 2021-04-30: 500 mg via ORAL
  Filled 2021-04-30: qty 1

## 2021-04-30 MED ORDER — PREDNISONE 20 MG PO TABS
60.0000 mg | ORAL_TABLET | Freq: Once | ORAL | Status: AC
  Administered 2021-04-30: 60 mg via ORAL
  Filled 2021-04-30: qty 3

## 2021-04-30 MED ORDER — PREDNISONE 20 MG PO TABS: 60.0000 mg | ORAL_TABLET | Freq: Every day | ORAL | 0 refills | Status: AC

## 2021-04-30 NOTE — ED Provider Notes (Signed)
[facility] Provider Note    Event Date/Time   First MD Initiated Contact with Patient 04/30/21 [phone]     (approximate)   History   Sore Throat   HPI  Bethany Bray is a 31 y.o. female with a history of asthma who presents for evaluation of cough, sore throat, congestion.  She denies chest pain or shortness of breath, vomiting or diarrhea.  Patient has had her symptoms for over a week now.  Has noticed some intermittent wheezing which resolves with her inhaler.  No prior history of PE or DVT.  She reports that her symptoms are not improving.     Physical Exam   Triage Vital Signs: ED Triage Vitals [04/29/21 2334]  Enc Vitals Group     BP 119/89     Pulse Rate 91     Resp 16     Temp 98 F (36.7 C)     Temp src      SpO2 97 %     Weight      Height      Head Circumference      Peak Flow      Pain Score      Pain Loc      Pain Edu?      Excl. in GC?     Most recent vital signs: Vitals:   04/29/21 2334  BP: 119/89  Pulse: 91  Resp: 16  Temp: 98 F (36.7 C)  SpO2: 97%     General: Awake, no distress.  CV:  RRR, no murmurs, strong pulses x 4 Resp:  Normal effort. Lungs clear to auscultation Abd:  Soft, non tender, non distended, positive bowel sounds, no rebound or guarding MSK:  No edema or cyanosis Neuro:  Normal speech, face is symmetric, moving all extremities with no gross focal neuro deficit   ED Results / Procedures / Treatments   Labs (all labs ordered are listed, but only abnormal results are displayed) Labs Reviewed  GROUP A STREP BY PCR  RESP PANEL BY RT-PCR (FLU A&B, COVID) ARPGX2     EKG  none   RADIOLOGY I have personally reviewed the images performed during this visit and I agree with the Radiologist's read.   Interpretation by Radiologist:  DG Chest 2 View  Result Date: 04/30/2021 CLINICAL DATA:  Cough with sore throat and nasal congestion. EXAM: CHEST - 2 VIEW COMPARISON:  June 02, 2020  FINDINGS: The heart size and mediastinal contours are within normal limits. Both lungs are clear. The visualized skeletal structures are unremarkable. IMPRESSION: No active cardiopulmonary disease. Electronically Signed   By: Aram Candela M.D.   On: 04/30/2021 00:20       PROCEDURES:  Critical Care performed: No  Procedures   MEDICATIONS ORDERED IN ED: Medications  azithromycin (ZITHROMAX) tablet 500 mg (has no administration in time range)  predniSONE (DELTASONE) tablet 60 mg (has no administration in time range)     IMPRESSION / MDM / ASSESSMENT AND PLAN / ED COURSE  I reviewed the triage vital signs and the nursing notes.   30 y.o. female with a history of asthma who presents for evaluation of cough, sore throat, congestion x 9 days.  She is extremely well-appearing in no distress with normal vital signs, normal work of breathing normal sats, lungs are clear to auscultation.  Patient has a lot of upper congestion and a mild cough.  Chest x-ray visualized by me with no signs of pneumonia, confirmed by  radiology.  COVID, flu, and strep throat negative.  Oropharynx is clear with no erythema or exudate.  Patient's presentation is concerning for a viral upper respiratory infection and bronchitis.  Due to history of asthma and symptoms that are not improving for 9 days we will start patient on a course of prednisone and Z-Pak.  She does have a inhaler at home and recommended using for episodes of wheezing.  Recommended return to the hospital for chest pain or shortness of breath.  Otherwise close follow-up with primary care doctor.  Patient was comfortable with this plan.  Old medical records reviewed including last visit in to the ER 5 days ago.      FINAL CLINICAL IMPRESSION(S) / ED DIAGNOSES   Final diagnoses:  Viral URI with cough  Bronchitis     Rx / DC Orders   ED Discharge Orders          Ordered    azithromycin (ZITHROMAX) 250 MG tablet        04/30/21 0100     predniSONE (DELTASONE) 20 MG tablet  Daily with breakfast        04/30/21 0100             Note:  This document was prepared using Dragon voice recognition software and may include unintentional dictation errors.    [name], [place], MD 04/30/21 [phone]

## 2021-04-30 NOTE — ED Notes (Signed)
Pt complains of fever, cough, chills, and headache.

## 2021-05-27 ENCOUNTER — Other Ambulatory Visit: Payer: Self-pay

## 2021-05-27 ENCOUNTER — Emergency Department
Admission: EM | Admit: 2021-05-27 | Discharge: 2021-05-27 | Disposition: A | Discharge status: Home / Self Care | Payer: Self-pay | Attending: Emergency Medicine | Admitting: Emergency Medicine

## 2021-05-27 ENCOUNTER — Encounter: Payer: Self-pay | Admitting: Emergency Medicine

## 2021-05-27 DIAGNOSIS — B349 Viral infection, unspecified: Secondary | ICD-10-CM | POA: Insufficient documentation

## 2021-05-27 DIAGNOSIS — J45909 Unspecified asthma, uncomplicated: Secondary | ICD-10-CM | POA: Insufficient documentation

## 2021-05-27 DIAGNOSIS — E119 Type 2 diabetes mellitus without complications: Secondary | ICD-10-CM | POA: Insufficient documentation

## 2021-05-27 DIAGNOSIS — Z20822 Contact with and (suspected) exposure to covid-19: Secondary | ICD-10-CM | POA: Insufficient documentation

## 2021-05-27 LAB — URINALYSIS, MICROSCOPIC (REFLEX): Bacteria, UA: NONE SEEN

## 2021-05-27 LAB — RESP PANEL BY RT-PCR (FLU A&B, COVID) ARPGX2
Influenza A by PCR: NEGATIVE
Influenza B by PCR: NEGATIVE
SARS Coronavirus 2 by RT PCR: NEGATIVE

## 2021-05-27 LAB — COMPREHENSIVE METABOLIC PANEL
ALT: 12 U/L (ref 0–44)
AST: 14 U/L — ABNORMAL LOW (ref 15–41)
Albumin: 3.9 g/dL (ref 3.5–5.0)
Alkaline Phosphatase: 46 U/L (ref 38–126)
Anion gap: 6 (ref 5–15)
BUN: 15 mg/dL (ref 6–20)
CO2: 22 mmol/L (ref 22–32)
Calcium: 8.4 mg/dL — ABNORMAL LOW (ref 8.9–10.3)
Chloride: 104 mmol/L (ref 98–111)
Creatinine, Ser: 0.66 mg/dL (ref 0.44–1.00)
GFR, Estimated: >60 mL/min (ref >60)
Glucose, Bld: 95 mg/dL (ref 70–99)
Potassium: 3.8 mmol/L (ref 3.5–5.1)
Sodium: 132 mmol/L — ABNORMAL LOW (ref 135–145)
Total Bilirubin: 0.8 mg/dL (ref 0.3–1.2)
Total Protein: 7.2 g/dL (ref 6.5–8.1)

## 2021-05-27 LAB — CBC WITH DIFFERENTIAL/PLATELET
Abs Immature Granulocytes: 0.03 10*3/uL (ref 0.00–0.07)
Basophils Absolute: 0 10*3/uL (ref 0.0–0.1)
Basophils Relative: 0 %
Eosinophils Absolute: 0 10*3/uL (ref 0.0–0.5)
Eosinophils Relative: 0 %
HCT: 38.9 % (ref 36.0–46.0)
Hemoglobin: 12.4 g/dL (ref 12.0–15.0)
Immature Granulocytes: 0 %
Lymphocytes Relative: 8 %
Lymphs Abs: 0.9 10*3/uL (ref 0.7–4.0)
MCH: 26.8 pg (ref 26.0–34.0)
MCHC: 31.9 g/dL (ref 30.0–36.0)
MCV: 84 fL (ref 80.0–100.0)
Monocytes Absolute: 0.5 10*3/uL (ref 0.1–1.0)
Monocytes Relative: 4 %
Neutro Abs: 10.4 10*3/uL — ABNORMAL HIGH (ref 1.7–7.7)
Neutrophils Relative %: 88 %
Platelets: 298 10*3/uL (ref 150–400)
RBC: 4.63 MIL/uL (ref 3.87–5.11)
RDW: 13.3 % (ref 11.5–15.5)
WBC: 11.8 10*3/uL — ABNORMAL HIGH (ref 4.0–10.5)
nRBC: 0 % (ref 0.0–0.2)

## 2021-05-27 LAB — URINALYSIS, ROUTINE W REFLEX MICROSCOPIC
Glucose, UA: NEGATIVE mg/dL
Hgb urine dipstick: NEGATIVE
Ketones, ur: NEGATIVE mg/dL
Leukocytes,Ua: NEGATIVE
Nitrite: NEGATIVE
Protein, ur: 30 mg/dL — AB
Specific Gravity, Urine: 1.025 (ref 1.005–1.030)
pH: 6.5 (ref 5.0–8.0)

## 2021-05-27 LAB — PREGNANCY, URINE: Preg Test, Ur: NEGATIVE

## 2021-05-27 LAB — LIPASE, BLOOD: Lipase: 27 U/L (ref 11–51)

## 2021-05-27 MED ORDER — ONDANSETRON 4 MG PO TBDP: 4.0000 mg | ORAL_TABLET | Freq: Three times a day (TID) | ORAL | 0 refills | Status: DC | PRN

## 2021-05-27 MED ORDER — ONDANSETRON 4 MG PO TBDP
4.0000 mg | ORAL_TABLET | Freq: Once | ORAL | Status: AC
Start: 2021-05-27 — End: 2021-05-27
  Administered 2021-05-27: 4 mg via ORAL
  Filled 2021-05-27: qty 1

## 2021-05-27 NOTE — ED Notes (Signed)
Pt provided discharge instructions and prescription information. Pt was given the opportunity to ask questions and questions were answered. Discharge signature not obtained in the setting of the COVID-19 pandemic in order to reduce high touch surfaces.  ° °

## 2021-05-27 NOTE — ED Triage Notes (Addendum)
Pt to ED via POV stating that she is having N/V and body aches since 7 pm last night. Pt also reports headache. Pt states that her daughter has been sick as well. Pt is in AND.

## 2021-05-27 NOTE — Discharge Instructions (Signed)
Follow-up with your primary care provider if any continued problems or concerns.  You may take Tylenol or ibuprofen as needed for body aches.  A prescription for Zofran was sent to your pharmacy to take as needed for nausea and vomiting.  Drink fluids frequently to stay hydrated.  Today your lab work is negative for COVID and influenza.

## 2021-05-27 NOTE — ED Provider Triage Note (Signed)
Emergency Medicine Provider Triage Evaluation Note  Bethany Bray , a 31 y.o. female  was evaluated in triage.  Pt complains of nausea vomiting.Marland Kitchen generalized abdominal cramping.   Review of Systems  Positive: Nv cramping  Negative: Chest pain   Physical Exam  BP 118/75 (BP Location: Left Arm)    Pulse (!) 105    Temp 99.3 F (37.4 C) (Oral)    Resp 18    Ht 5' (1.524 m)    Wt 90.7 kg    LMP 04/16/2021    SpO2 97%    BMI 39.06 kg/m  Gen:   Awake, no distress   Resp:  Normal effort  MSK:   Moves extremities without difficulty  Other:  No rebound or gaurding   Medical Decision Making  Medically screening exam initiated at 9:23 AM.  Appropriate orders placed.  Bethany Bray was informed that the remainder of the evaluation will be completed by another provider, this initial triage assessment does not replace that evaluation, and the importance of remaining in the ED until their evaluation is complete.  Labs, covid    Bethany Se, MD 05/27/21 [phone]

## 2021-05-27 NOTE — ED Provider Notes (Signed)
[facility] Provider Note    Event Date/Time   First MD Initiated Contact with Patient 05/27/21 1121     (approximate)   History   Emesis and Generalized Body Aches   HPI  Bethany Bray is a 31 y.o. female   presents to the ED with complaint of nausea, vomiting, body aches and headache since last evening.  Patient states that her daughter has been sick with similar symptoms as well.  Patient has a history of asthma, anemia and diabetes mellitus.  She rates her pain as 5 out of 10.      Physical Exam   Triage Vital Signs: ED Triage Vitals  Enc Vitals Group     BP 05/27/21 0915 118/75     Pulse Rate 05/27/21 0915 (!) 105     Resp 05/27/21 0915 18     Temp 05/27/21 0915 99.3 F (37.4 C)     Temp Source 05/27/21 0915 Oral     SpO2 05/27/21 0915 97 %     Weight 05/27/21 0916 200 lb (90.7 kg)     Height 05/27/21 0916 5' (1.524 m)     Head Circumference --      Peak Flow --      Pain Score 05/27/21 0922 5     Pain Loc --      Pain Edu? --      Excl. in [place]? --     Most recent vital signs: Vitals:   05/27/21 1300 05/27/21 1330  BP: 106/67 104/73  Pulse: 89 91  Resp:  16  Temp:    SpO2: 100% 99%     General: Awake, no distress.  CV:  Good peripheral perfusion.  Heart regular rate and rhythm without murmur. Resp:  Normal effort.  Lungs are clear bilaterally. Abd:  No distention.     ED Results / Procedures / Treatments   Labs (all labs ordered are listed, but only abnormal results are displayed) Labs Reviewed  CBC WITH DIFFERENTIAL/PLATELET - Abnormal; Notable for the following components:      Result Value   WBC 11.8 (*)    Neutro Abs 10.4 (*)    All other components within normal limits  COMPREHENSIVE METABOLIC PANEL - Abnormal; Notable for the following components:   Sodium 132 (*)    Calcium 8.4 (*)    AST 14 (*)    All other components within normal limits  URINALYSIS, ROUTINE W REFLEX MICROSCOPIC - Abnormal; Notable  for the following components:   Bilirubin Urine SMALL (*)    Protein, ur 30 (*)    All other components within normal limits  RESP PANEL BY RT-PCR (FLU A&B, COVID) ARPGX2  LIPASE, BLOOD  PREGNANCY, URINE  URINALYSIS, MICROSCOPIC (REFLEX)      PROCEDURES:  Critical Care performed:   Procedures   MEDICATIONS ORDERED IN ED: Medications  ondansetron (ZOFRAN-ODT) disintegrating tablet 4 mg (4 mg Oral Given 05/27/21 1239)     IMPRESSION / MDM / ASSESSMENT AND PLAN / ED COURSE  I reviewed the triage vital signs and the nursing notes.   Differential diagnosis includes, but is not limited to, URI, COVID, influenza, urinary tract infection, viral illness.   31 year old female presents to the ED with complaint of nausea, vomiting and body aches that started last evening along with a headache.  Patient has family member that is sick at home with similar symptoms.  After looking at her respiratory nasal swab patient was made aware that her COVID  and influenza test are negative.  Urinalysis was negative with the exception of protein and a small amount of bilirubin.  Patient white count was mildly elevated at 11.8 and her glucose was 95.  Patient was made aware that most likely she has a viral illness but reassured that she does not have COVID or influenza.  She still remains contagious as well as the other family member.  Since being in the emergency department she has had no further vomiting and nausea is improved after being given Zofran.  A prescription for the same was sent to her pharmacy and she is encouraged to drink lots of fluids to stay hydrated.  Tylenol or ibuprofen as needed for body aches and to follow-up with her primary care provider if any continued problems or concerns.       FINAL CLINICAL IMPRESSION(S) / ED DIAGNOSES   Final diagnoses:  Viral illness     Rx / DC Orders   ED Discharge Orders          Ordered    ondansetron (ZOFRAN-ODT) 4 MG disintegrating tablet   Every 8 hours PRN        05/27/21 1325             Note:  This document was prepared using Dragon voice recognition software and may include unintentional dictation errors.   Tommi Rumps, PA-C 05/27/21 1536    Concha Se, MD 05/28/21 [phone]

## 2021-06-09 ENCOUNTER — Other Ambulatory Visit: Payer: Self-pay

## 2021-06-09 ENCOUNTER — Emergency Department
Admission: EM | Admit: 2021-06-09 | Discharge: 2021-06-09 | Disposition: A | Discharge status: Home / Self Care | Payer: Self-pay | Attending: Emergency Medicine | Admitting: Emergency Medicine

## 2021-06-09 ENCOUNTER — Encounter: Payer: Self-pay | Admitting: Emergency Medicine

## 2021-06-09 DIAGNOSIS — J020 Streptococcal pharyngitis: Secondary | ICD-10-CM | POA: Insufficient documentation

## 2021-06-09 DIAGNOSIS — E119 Type 2 diabetes mellitus without complications: Secondary | ICD-10-CM | POA: Insufficient documentation

## 2021-06-09 LAB — GROUP A STREP BY PCR: Group A Strep by PCR: DETECTED — AB

## 2021-06-09 MED ORDER — AMOXICILLIN 875 MG PO TABS: 875.0000 mg | ORAL_TABLET | Freq: Two times a day (BID) | ORAL | 0 refills | Status: DC

## 2021-06-09 NOTE — ED Triage Notes (Signed)
Pt via POV from home. Pt c/o fever, sore throat that started yesterday. Pt children was recently dx with strep. Denies taking Tylenol or Ibuprofen today. Pt is A&Ox4 and NAD.

## 2021-06-09 NOTE — ED Provider Notes (Signed)
[facility] Provider Note    Event Date/Time   First MD Initiated Contact with Patient 06/09/21 [phone]     (approximate)   History   Sore Throat   HPI  Bethany Bray is a 31 y.o. female presents to the ED with complaint of sore throat that started yesterday and she also had fever last night.  Patient reports that her child at home was diagnosed with strep throat prior to her beginning to have symptoms.  She has also noted white spots on her tonsils which made her think that she also has strep throat.  Patient has a history of abscess melena and diabetes.  Currently she rates her pain as a 7 out of 10     Physical Exam   Triage Vital Signs: ED Triage Vitals  Enc Vitals Group     BP 06/09/21 0846 118/89     Pulse Rate 06/09/21 0846 85     Resp 06/09/21 0846 17     Temp 06/09/21 0846 98.1 F (36.7 C)     Temp Source 06/09/21 0846 Oral     SpO2 06/09/21 0846 98 %     Weight 06/09/21 0848 210 lb (95.3 kg)     Height 06/09/21 0848 5' (1.524 m)     Head Circumference --      Peak Flow --      Pain Score 06/09/21 0857 7     Pain Loc --      Pain Edu? --      Excl. in [place]? --     Most recent vital signs: Vitals:   06/09/21 0846  BP: 118/89  Pulse: 85  Resp: 17  Temp: 98.1 F (36.7 C)  SpO2: 98%     General: Awake, no distress.  Alert, able to talk in complete sentences without any difficulty. CV:  Good peripheral perfusion.  Heart regular rate and rhythm without murmur. Resp:  Normal effort.  Clear bilaterally. Abd:  No distention.  Other:  On examination of the posterior pharynx tonsils are enlarged with white exudate present.  Uvula is midline.  There is minimal cervical lymphadenopathy bilaterally.   ED Results / Procedures / Treatments   Labs (all labs ordered are listed, but only abnormal results are displayed) Labs Reviewed  GROUP A STREP BY PCR - Abnormal; Notable for the following components:      Result Value   Group A  Strep by PCR DETECTED (*)    All other components within normal limits       PROCEDURES:  Critical Care performed:   Procedures   MEDICATIONS ORDERED IN ED: Medications - No data to display   IMPRESSION / MDM / ASSESSMENT AND PLAN / ED COURSE  I reviewed the triage vital signs and the nursing notes.   Differential diagnosis includes, but is not limited to, viral pharyngitis, strep pharyngitis, exudative tonsillitis, mono.  31 year old female presents to the ED with complaint of sore throat and seeing "white stuff on her tonsils last night.  She states that her child at home was diagnosed with strep this past week.  Patient denies any difficulty swallowing is able to talk in complete sentences without any difficulty.  Physical exam is positive for enlarged tonsils with white exudate.  Strep test was positive and patient was made aware.  She is to continue with Tylenol and ibuprofen as needed for fever and throat pain.  Amoxicillin 875 twice daily for 10 days was sent to the pharmacy  and she is aware that she needs to complete the entire course.  We also discussed changing toothbrushes to prevent reinfection.  She is to follow-up with her PCP if any continued problems and return to the emergency department if any severe worsening of her symptoms over the weekend.        FINAL CLINICAL IMPRESSION(S) / ED DIAGNOSES   Final diagnoses:  Strep pharyngitis     Rx / DC Orders   ED Discharge Orders          Ordered    amoxicillin (AMOXIL) 875 MG tablet  2 times daily        06/09/21 1014             Note:  This document was prepared using Dragon voice recognition software and may include unintentional dictation errors.   Tommi Rumps, PA-C 06/09/21 1019    Georga Hacking, MD 06/09/21 [phone]

## 2021-06-09 NOTE — Discharge Instructions (Signed)
Follow-up with your primary care provider if any continued problems.  A prescription for amoxicillin was sent to the pharmacy to take for the entire 10-day course.  For the first 24 hours you are contagious for strep throat and should avoid direct contact with family members.  Drink lots of fluids to stay hydrated.  Tylenol or ibuprofen as needed for throat pain or fever.

## 2022-01-08 IMAGING — CR DG CHEST 2V
1 series · 2 of 2 positions shown · non-contrast
Comparison: January 29, 2018.

CLINICAL DATA: Shortness of breath and chest tightness.

EXAM:
CHEST - 2 VIEW

[Series 1: dg chest 2 view · 0.14mm/px · 2 of 2 slices shown]
[im 1/2]
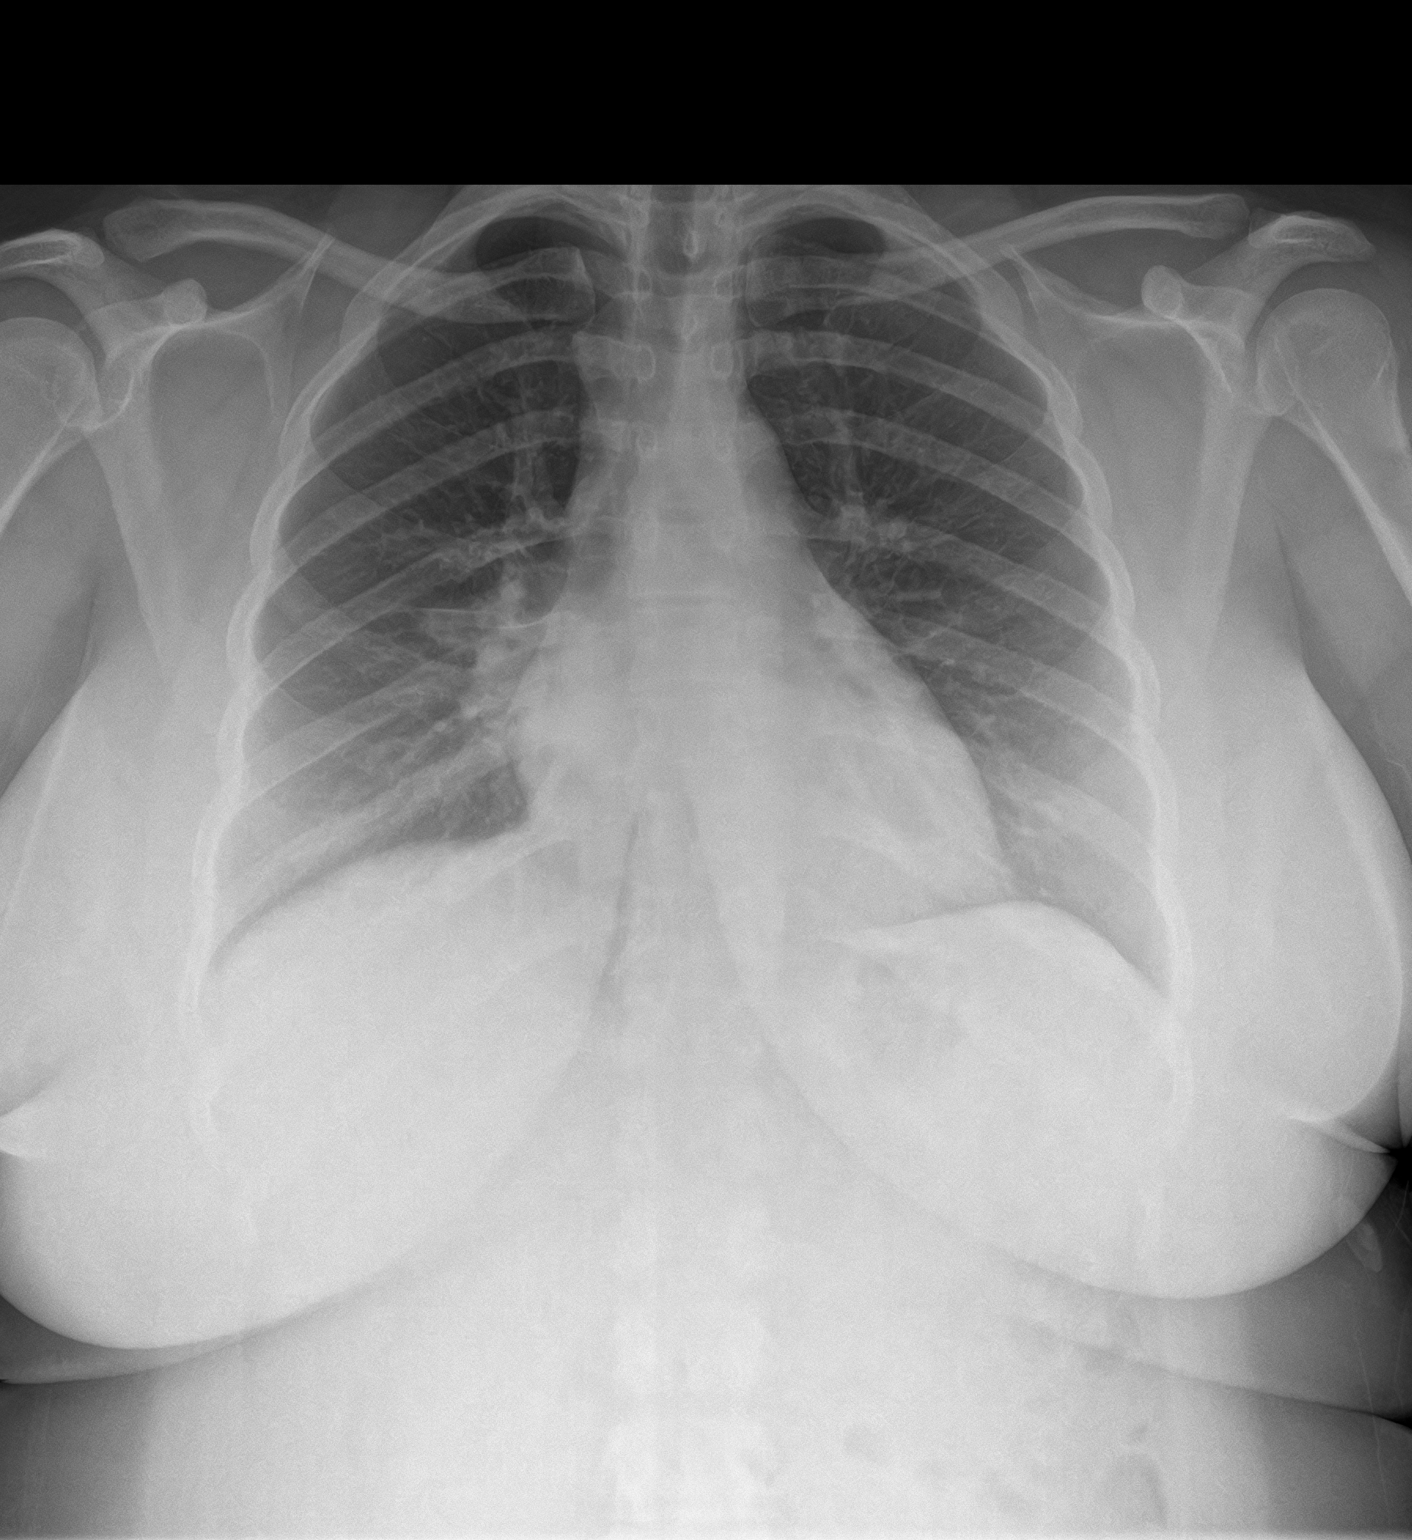
[im 2/2]
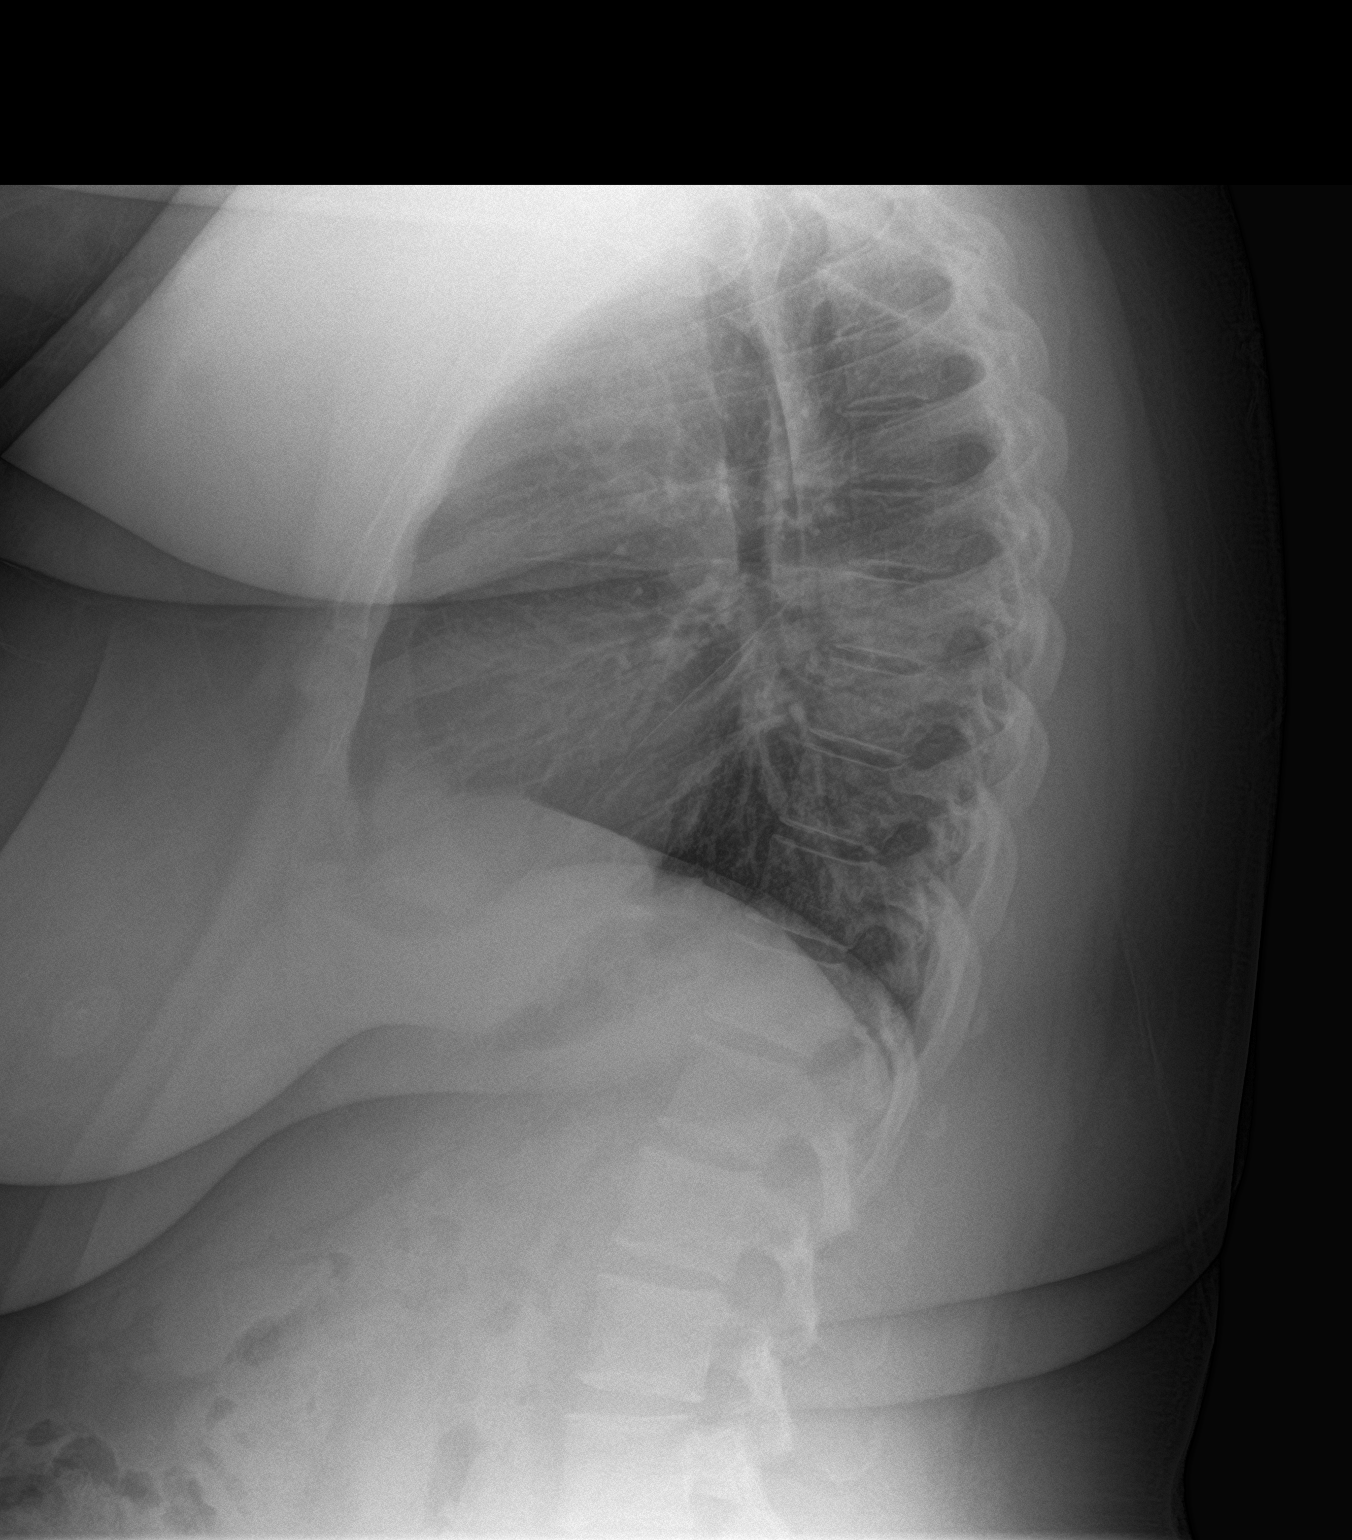

[2 of 2 positions shown; findings below may reference images not displayed]

FINDINGS: Right perihilar airspace opacity. No visible pneumothorax or pleural
effusions. Cardiomediastinal silhouette is within normal limits. No
acute osseous abnormality.
IMPRESSION: Right perihilar airspace opacity, concerning for pneumonia.
Recommend short interval follow-up after treatment to ensure
resolution.

## 2022-02-06 ENCOUNTER — Emergency Department
Admission: EM | Admit: 2022-02-06 | Discharge: 2022-02-06 | Disposition: A | Discharge status: Home / Self Care | Payer: Managed Care, Other (non HMO) | Attending: Emergency Medicine | Admitting: Emergency Medicine

## 2022-02-06 DIAGNOSIS — R6883 Chills (without fever): Secondary | ICD-10-CM | POA: Insufficient documentation

## 2022-02-06 DIAGNOSIS — E119 Type 2 diabetes mellitus without complications: Secondary | ICD-10-CM | POA: Insufficient documentation

## 2022-02-06 DIAGNOSIS — J029 Acute pharyngitis, unspecified: Secondary | ICD-10-CM | POA: Insufficient documentation

## 2022-02-06 DIAGNOSIS — M791 Myalgia, unspecified site: Secondary | ICD-10-CM | POA: Diagnosis not present

## 2022-02-06 DIAGNOSIS — J45909 Unspecified asthma, uncomplicated: Secondary | ICD-10-CM | POA: Diagnosis not present

## 2022-02-06 DIAGNOSIS — Z20822 Contact with and (suspected) exposure to covid-19: Secondary | ICD-10-CM | POA: Insufficient documentation

## 2022-02-06 LAB — GROUP A STREP BY PCR: Group A Strep by PCR: NOT DETECTED

## 2022-02-06 LAB — SARS CORONAVIRUS 2 BY RT PCR: SARS Coronavirus 2 by RT PCR: NEGATIVE

## 2022-02-06 MED ORDER — AMOXICILLIN 500 MG PO TABS: 500.0000 mg | ORAL_TABLET | Freq: Two times a day (BID) | ORAL | 0 refills | Status: AC

## 2022-02-06 MED ORDER — PREDNISONE 50 MG PO TABS: 50.0000 mg | ORAL_TABLET | Freq: Every day | ORAL | 0 refills | Status: AC

## 2022-02-06 MED ORDER — FLUCONAZOLE 100 MG PO TABS: 100.0000 mg | ORAL_TABLET | Freq: Once | ORAL | 0 refills | Status: AC

## 2022-02-06 NOTE — Discharge Instructions (Addendum)
-  Take the full course of the amoxicillin and prednisone as prescribed. You may take the fluconazole once if you begin to develop a vaginal yeast infection.  -Please schedule an appointment with the ENT provider listed above, as discussed.  -Return to the emergency department at any time if you begin to experience any new or worsening symptoms.

## 2022-02-06 NOTE — ED Triage Notes (Signed)
Pt presents to ED via POV c/o sore throat. Pt states tonsils are swollen with white patches. Has been having chills and aches since this morning. Pt denies CP AND SOB

## 2022-02-06 NOTE — ED Provider Notes (Signed)
[facility] Provider Note    None    (approximate)   History   Chief Complaint Sore Throat   HPI Bethany Bray is a 31 y.o. female, history of asthma, anemia, diabetes, presents emergency department for evaluation of sore throat.  Patient states that she woke up this morning with sore throat, chills, body aches.  She states that she frequently gets strep throat and believes that she is having it again.  She states that this will be the fourth time this year.  Denies chest pain, shortness of breath, abdominal pain, dysphagia, flank pain, nausea/vomiting, diarrhea, dysuria, headache, rash/lesions, or dizziness/lightheadedness.  History Limitations: No limitations.        Physical Exam  Triage Vital Signs: ED Triage Vitals [02/06/22 1945]  Enc Vitals Group     BP (!) 130/93     Pulse Rate (!) 102     Resp 20     Temp 100 F (37.8 C)     Temp Source Oral     SpO2      Weight      Height      Head Circumference      Peak Flow      Pain Score 8     Pain Loc      Pain Edu?      Excl. in [place]?     Most recent vital signs: Vitals:   02/06/22 1945  BP: (!) 130/93  Pulse: (!) 102  Resp: 20  Temp: 100 F (37.8 C)    General: Awake, NAD.  Skin: Warm, dry. No rashes or lesions.  Eyes: PERRL. Conjunctivae normal.  CV: Good peripheral perfusion.  Resp: Normal effort.  Abd: Soft, non-tender. No distention.  Neuro: At baseline. No gross neurological deficits.  Musculoskeletal: Normal ROM of all extremities.  Focused Exam: Exquisitely erythematous throat with significant tonsillar swelling and exudates.  Anterior cervical lymphadenopathy present as well.  Uvula midline.  Physical Exam    ED Results / Procedures / Treatments  Labs (all labs ordered are listed, but only abnormal results are displayed) Labs Reviewed  GROUP A STREP BY PCR  SARS CORONAVIRUS 2 BY RT PCR     EKG N/A.    RADIOLOGY  ED Provider Interpretation:  N/A.  No results found.  PROCEDURES:  Critical Care performed: N/A.  Procedures    MEDICATIONS ORDERED IN ED: Medications - No data to display   IMPRESSION / MDM / [place] / ED COURSE  I reviewed the triage vital signs and the nursing notes.                              Differential diagnosis includes, but is not limited to, strep pharyngitis, tonsillitis, COVID-19, influenza, viral URI   Assessment/Plan Patient presents with sore throat x1 day.  COVID-19 PCR negative.  Group A strep PCR is negative, however her signs and symptoms are highly suggestive of strep pharyngitis and she has had this several times in the past has responded well to antibiotics.  We will provide her with a course of amoxicillin, as well as prednisone, though did caution her to be careful with the prednisone in regards to her diabetes.  Given the recurrent nature of these infections, will also provide her with a referral to ENT for further evaluation management.  She is amenable to this plan.  Encouraged her to continue taking Tylenol/ibuprofen as needed for fevers and  body aches.  Will discharge.  Provided the patient with anticipatory guidance, return precautions, and educational material. Encouraged the patient to return to the emergency department at any time if they begin to experience any new or worsening symptoms. Patient expressed understanding and agreed with the plan.   Patient's presentation is most consistent with acute complicated illness / injury requiring diagnostic workup.       FINAL CLINICAL IMPRESSION(S) / ED DIAGNOSES   Final diagnoses:  Pharyngitis, unspecified etiology     Rx / DC Orders   ED Discharge Orders          Ordered    amoxicillin (AMOXIL) 500 MG tablet  2 times daily        02/06/22 2046    predniSONE (DELTASONE) 50 MG tablet  Daily with breakfast        02/06/22 2046    fluconazole (DIFLUCAN) 100 MG tablet   Once        02/06/22 2046              Note:  This document was prepared using Dragon voice recognition software and may include unintentional dictation errors.   Varney Daily, [place] 02/06/22 2324    Georga Hacking, MD 02/07/22 Perlie Mayo

## 2022-04-09 ENCOUNTER — Other Ambulatory Visit: Payer: Self-pay

## 2022-04-09 ENCOUNTER — Emergency Department
Admission: EM | Admit: 2022-04-09 | Discharge: 2022-04-09 | Disposition: A | Discharge status: Home / Self Care | Payer: Self-pay | Attending: Emergency Medicine | Admitting: Emergency Medicine

## 2022-04-09 ENCOUNTER — Emergency Department: Payer: Self-pay

## 2022-04-09 DIAGNOSIS — N83209 Unspecified ovarian cyst, unspecified side: Secondary | ICD-10-CM

## 2022-04-09 DIAGNOSIS — N83201 Unspecified ovarian cyst, right side: Secondary | ICD-10-CM | POA: Insufficient documentation

## 2022-04-09 LAB — COMPREHENSIVE METABOLIC PANEL
ALT: 11 U/L (ref 0–44)
AST: 20 U/L (ref 15–41)
Albumin: 3.8 g/dL (ref 3.5–5.0)
Alkaline Phosphatase: 46 U/L (ref 38–126)
Anion gap: 5 (ref 5–15)
BUN: 19 mg/dL (ref 6–20)
CO2: 27 mmol/L (ref 22–32)
Calcium: 9.1 mg/dL (ref 8.9–10.3)
Chloride: 106 mmol/L (ref 98–111)
Creatinine, Ser: 0.84 mg/dL (ref 0.44–1.00)
GFR, Estimated: >60 mL/min (ref >60)
Glucose, Bld: 112 mg/dL — ABNORMAL HIGH (ref 70–99)
Potassium: 3.8 mmol/L (ref 3.5–5.1)
Sodium: 138 mmol/L (ref 135–145)
Total Bilirubin: 0.6 mg/dL (ref 0.3–1.2)
Total Protein: 7.3 g/dL (ref 6.5–8.1)

## 2022-04-09 LAB — CBC
HCT: 39.5 % (ref 36.0–46.0)
Hemoglobin: 12.5 g/dL (ref 12.0–15.0)
MCH: 26.1 pg (ref 26.0–34.0)
MCHC: 31.6 g/dL (ref 30.0–36.0)
MCV: 82.5 fL (ref 80.0–100.0)
Platelets: 306 10*3/uL (ref 150–400)
RBC: 4.79 MIL/uL (ref 3.87–5.11)
RDW: 13 % (ref 11.5–15.5)
WBC: 10.8 10*3/uL — ABNORMAL HIGH (ref 4.0–10.5)
nRBC: 0 % (ref 0.0–0.2)

## 2022-04-09 LAB — LIPASE, BLOOD: Lipase: 31 U/L (ref 11–51)

## 2022-04-09 LAB — URINALYSIS, ROUTINE W REFLEX MICROSCOPIC
Bilirubin Urine: NEGATIVE
Glucose, UA: NEGATIVE mg/dL
Hgb urine dipstick: NEGATIVE
Ketones, ur: NEGATIVE mg/dL
Leukocytes,Ua: NEGATIVE
Nitrite: NEGATIVE
Protein, ur: NEGATIVE mg/dL
Specific Gravity, Urine: 1.029 (ref 1.005–1.030)
pH: 6 (ref 5.0–8.0)

## 2022-04-09 LAB — POC URINE PREG, ED: Preg Test, Ur: NEGATIVE

## 2022-04-09 MED ORDER — HYDROCODONE-ACETAMINOPHEN 5-325 MG PO TABS: 1.0000 | ORAL_TABLET | ORAL | 0 refills | Status: AC | PRN

## 2022-04-09 MED ORDER — IOHEXOL 300 MG/ML  SOLN
100.0000 mL | Freq: Once | INTRAMUSCULAR | Status: AC | PRN
  Administered 2022-04-09: 100 mL via INTRAVENOUS

## 2022-04-09 NOTE — ED Provider Notes (Signed)
[facility] Provider Note   Event Date/Time   First MD Initiated Contact with Patient 04/09/22 2141     (approximate) History  Abdominal Pain  HPI Bethany Bray is a 31 y.o. female is a 31 year old female who presents for 3 days of right lower quadrant abdominal pain with associated mild nausea.  Patient states that this pain was 10/10 initially and she states she was unable to move due to the intensity of the pain.  Patient endorses mild nausea over this time without any vomiting.  Patient denies any similar symptoms in the past.  Patient also endorses diarrhea ROS: Patient currently denies any vision changes, tinnitus, difficulty speaking, facial droop, sore throat, chest pain, shortness of breath, abdominal pain, nausea/vomiting, dysuria, or weakness/numbness/paresthesias in any extremity   Physical Exam  Triage Vital Signs: ED Triage Vitals  Enc Vitals Group     BP 04/09/22 1921 116/88     Pulse Rate 04/09/22 1921 91     Resp 04/09/22 1921 18     Temp 04/09/22 1921 99.1 F (37.3 C)     Temp Source 04/09/22 1921 Oral     SpO2 04/09/22 1921 97 %     Weight 04/09/22 1921 210 lb (95.3 kg)     Height 04/09/22 1921 5' (1.524 m)     Head Circumference --      Peak Flow --      Pain Score 04/09/22 1923 6     Pain Loc --      Pain Edu? --      Excl. in GC? --    Most recent vital signs: Vitals:   04/09/22 1921 04/09/22 2323  BP: 116/88 122/72  Pulse: 91 85  Resp: 18 18  Temp: 99.1 F (37.3 C) 98.8 F (37.1 C)  SpO2: 97% 98%   General: Awake, oriented x4. CV:  Good peripheral perfusion.  Resp:  Normal effort.  Abd:  No distention.  Right lower quadrant tenderness to palpation Other:  Middle-aged morbidly obese African-American female laying in bed in no acute distress ED Results / Procedures / Treatments  Labs (all labs ordered are listed, but only abnormal results are displayed) Labs Reviewed  CBC - Abnormal; Notable for the following  components:      Result Value   WBC 10.8 (*)    All other components within normal limits  COMPREHENSIVE METABOLIC PANEL - Abnormal; Notable for the following components:   Glucose, Bld 112 (*)    All other components within normal limits  URINALYSIS, ROUTINE W REFLEX MICROSCOPIC - Abnormal; Notable for the following components:   Color, Urine YELLOW (*)    APPearance CLEAR (*)    All other components within normal limits  LIPASE, BLOOD  POC URINE PREG, ED   RADIOLOGY ED MD interpretation: CT of the abdomen and pelvis with IV contrast shows a right ovarian cyst this imaging was interpreted by me -Agree with radiology assessment Official radiology report(s): CT Abdomen Pelvis W Contrast  Result Date: 04/09/2022 CLINICAL DATA:  Right lower quadrant pain for several days, initial encounter EXAM: CT ABDOMEN AND PELVIS WITH CONTRAST TECHNIQUE: Multidetector CT imaging of the abdomen and pelvis was performed using the standard protocol following bolus administration of intravenous contrast. RADIATION DOSE REDUCTION: This exam was performed according to the departmental dose-optimization program which includes automated exposure control, adjustment of the mA and/or kV according to patient size and/or use of iterative reconstruction technique. CONTRAST:  OMNIPAQUE IOHEXOL 300 MG/ML  SOLN COMPARISON:  05/14/2018 FINDINGS: Lower chest: No acute abnormality. Hepatobiliary: No focal liver abnormality is seen. No gallstones, gallbladder wall thickening, or biliary dilatation. Pancreas: Unremarkable. No pancreatic ductal dilatation or surrounding inflammatory changes. Spleen: Normal in size without focal abnormality. Adrenals/Urinary Tract: Adrenal glands are within normal limits. Kidneys are well visualized bilaterally. No renal calculi or obstructive changes are seen. The ureters are within normal limits. The bladder is partially distended. Stomach/Bowel: No obstructive or inflammatory changes of the  colon are seen. The appendix is within normal limits. Small bowel and stomach are unremarkable. Vascular/Lymphatic: No significant vascular findings are present. No enlarged abdominal or pelvic lymph nodes. Reproductive: Uterus is within normal limits. A simple appearing right ovarian cyst is noted which measures 2.8 cm. Other: No abdominal wall hernia or abnormality. No abdominopelvic ascites. Musculoskeletal: No acute or significant osseous findings. IMPRESSION: No acute abnormality noted. 2.8 cm right ovarian simple-appearing cyst. No follow-up imaging is recommended. Reference: JACR 2020 Feb;17(2):248-254 Electronically Signed   By: Alcide Clever M.D.   On: 04/09/2022 23:00   PROCEDURES: Critical Care performed: No Procedures MEDICATIONS ORDERED IN ED: Medications  iohexol (OMNIPAQUE) 300 MG/ML solution 100 mL (100 mLs Intravenous Contrast Given 04/09/22 2248)   IMPRESSION / MDM / ASSESSMENT AND PLAN / ED COURSE  I reviewed the triage vital signs and the nursing notes.                              Patient's presentation is most consistent with acute presentation with potential threat to life or bodily function. Patients symptoms not typical for emergent causes of abdominal pain such as, but not limited to, appendicitis, abdominal aortic aneurysm, surgical biliary disease, pancreatitis, SBO, mesenteric ischemia, serious intra-abdominal bacterial illness. Presentation also not typical of gynecologic emergencies such as TOA, Ovarian Torsion, PID. Not Ectopic. Doubt atypical ACS. Given radiologic evidence of right ovarian cyst, it is highly likely that patient has had a ruptured ovarian cyst causing right lower quadrant abdominal pain.  Spoke to patient at length about follow-up with OB/GYN if symptoms recur. Pt tolerating PO. Disposition: Patient will be discharged with strict return precautions and follow up with primary MD within 12-24 hours for further evaluation. Patient understands that  this still may have an early presentation of an emergent medical condition such as appendicitis that will require a recheck.   FINAL CLINICAL IMPRESSION(S) / ED DIAGNOSES   Final diagnoses:  Ruptured ovarian cyst   Rx / DC Orders   ED Discharge Orders          Ordered    HYDROcodone-acetaminophen (NORCO) 5-325 MG tablet  Every 4 hours PRN        04/09/22 2316           Note:  This document was prepared using Dragon voice recognition software and may include unintentional dictation errors.   Merwyn Katos, MD 04/10/22 [phone]

## 2022-04-09 NOTE — ED Triage Notes (Signed)
Pt in with co RLQ pain intermittently since Sunday. Denies any n.v.d. or fever. Denies any dysuria.

## 2022-04-14 ENCOUNTER — Emergency Department: Payer: Managed Care, Other (non HMO)

## 2022-04-14 ENCOUNTER — Other Ambulatory Visit: Payer: Self-pay

## 2022-04-14 DIAGNOSIS — E119 Type 2 diabetes mellitus without complications: Secondary | ICD-10-CM | POA: Insufficient documentation

## 2022-04-14 DIAGNOSIS — R112 Nausea with vomiting, unspecified: Secondary | ICD-10-CM | POA: Insufficient documentation

## 2022-04-14 DIAGNOSIS — J45909 Unspecified asthma, uncomplicated: Secondary | ICD-10-CM | POA: Insufficient documentation

## 2022-04-14 DIAGNOSIS — N83201 Unspecified ovarian cyst, right side: Secondary | ICD-10-CM | POA: Insufficient documentation

## 2022-04-14 LAB — HCG, QUANTITATIVE, PREGNANCY: hCG, Beta Chain, Quant, S: <1 m[IU]/mL (ref <5)

## 2022-04-14 LAB — URINALYSIS, ROUTINE W REFLEX MICROSCOPIC
Bilirubin Urine: NEGATIVE
Glucose, UA: NEGATIVE mg/dL
Hgb urine dipstick: NEGATIVE
Ketones, ur: NEGATIVE mg/dL
Leukocytes,Ua: NEGATIVE
Nitrite: NEGATIVE
Protein, ur: NEGATIVE mg/dL
Specific Gravity, Urine: 1.025 (ref 1.005–1.030)
pH: 6 (ref 5.0–8.0)

## 2022-04-14 LAB — CBC WITH DIFFERENTIAL/PLATELET
Abs Immature Granulocytes: 0.02 10*3/uL (ref 0.00–0.07)
Basophils Absolute: 0 10*3/uL (ref 0.0–0.1)
Basophils Relative: 0 %
Eosinophils Absolute: 0.3 10*3/uL (ref 0.0–0.5)
Eosinophils Relative: 3 %
HCT: 40.2 % (ref 36.0–46.0)
Hemoglobin: 12.4 g/dL (ref 12.0–15.0)
Immature Granulocytes: 0 %
Lymphocytes Relative: 30 %
Lymphs Abs: 3.1 10*3/uL (ref 0.7–4.0)
MCH: 25.8 pg — ABNORMAL LOW (ref 26.0–34.0)
MCHC: 30.8 g/dL (ref 30.0–36.0)
MCV: 83.6 fL (ref 80.0–100.0)
Monocytes Absolute: 0.8 10*3/uL (ref 0.1–1.0)
Monocytes Relative: 8 %
Neutro Abs: 6.3 10*3/uL (ref 1.7–7.7)
Neutrophils Relative %: 59 %
Platelets: 303 10*3/uL (ref 150–400)
RBC: 4.81 MIL/uL (ref 3.87–5.11)
RDW: 13.3 % (ref 11.5–15.5)
WBC: 10.5 10*3/uL (ref 4.0–10.5)
nRBC: 0 % (ref 0.0–0.2)

## 2022-04-14 LAB — COMPREHENSIVE METABOLIC PANEL
ALT: 11 U/L (ref 0–44)
AST: 12 U/L — ABNORMAL LOW (ref 15–41)
Albumin: 3.9 g/dL (ref 3.5–5.0)
Alkaline Phosphatase: 47 U/L (ref 38–126)
Anion gap: 6 (ref 5–15)
BUN: 19 mg/dL (ref 6–20)
CO2: 25 mmol/L (ref 22–32)
Calcium: 9.2 mg/dL (ref 8.9–10.3)
Chloride: 104 mmol/L (ref 98–111)
Creatinine, Ser: 0.65 mg/dL (ref 0.44–1.00)
GFR, Estimated: >60 mL/min (ref >60)
Glucose, Bld: 141 mg/dL — ABNORMAL HIGH (ref 70–99)
Potassium: 3.8 mmol/L (ref 3.5–5.1)
Sodium: 135 mmol/L (ref 135–145)
Total Bilirubin: 0.5 mg/dL (ref 0.3–1.2)
Total Protein: 7.6 g/dL (ref 6.5–8.1)

## 2022-04-14 LAB — LIPASE, BLOOD: Lipase: 33 U/L (ref 11–51)

## 2022-04-14 LAB — POC URINE PREG, ED: Preg Test, Ur: NEGATIVE

## 2022-04-14 NOTE — ED Triage Notes (Signed)
Pt arrives via POV with CC of intermittent moderate to severe RLQ abdominal/pelvic pain that is worse with movement. Pt reports nausea and vomiting that is secondary to episodes of pain.

## 2022-04-15 ENCOUNTER — Emergency Department: Payer: Managed Care, Other (non HMO)

## 2022-04-15 ENCOUNTER — Emergency Department
Admission: EM | Admit: 2022-04-15 | Discharge: 2022-04-15 | Disposition: A | Discharge status: Home / Self Care | Payer: Managed Care, Other (non HMO) | Attending: Emergency Medicine | Admitting: Emergency Medicine

## 2022-04-15 DIAGNOSIS — R1031 Right lower quadrant pain: Secondary | ICD-10-CM

## 2022-04-15 DIAGNOSIS — R112 Nausea with vomiting, unspecified: Secondary | ICD-10-CM

## 2022-04-15 DIAGNOSIS — N83201 Unspecified ovarian cyst, right side: Secondary | ICD-10-CM

## 2022-04-15 MED ORDER — ONDANSETRON 4 MG PO TBDP
4.0000 mg | ORAL_TABLET | Freq: Once | ORAL | Status: AC
  Administered 2022-04-15: 4 mg via ORAL
  Filled 2022-04-15: qty 1

## 2022-04-15 MED ORDER — ONDANSETRON 4 MG PO TBDP: 4.0000 mg | ORAL_TABLET | Freq: Four times a day (QID) | ORAL | 0 refills | Status: AC | PRN

## 2022-04-15 NOTE — ED Provider Notes (Signed)
[facility] Provider Note    Event Date/Time   First MD Initiated Contact with Patient 04/15/22 0200     (approximate)   History   Abdominal Pain and Pelvic Pain   HPI  Bethany Bray is a 31 y.o. female with history of asthma, diabetes, obesity who presents to the emergency department with complaints of intermittent right lower quadrant abdominal pain with nausea and vomiting.  No diarrhea, fevers.  No dysuria, hematuria but does state after urination she feels pressure.  No abnormal vaginal bleeding or discharge.   History provided by patient.    Past Medical History:  Diagnosis Date   Anemia    Asthma    Diabetes mellitus without complication (HCC)     Past Surgical History:  Procedure Laterality Date   Birth Control Implant Removal     CESAREAN SECTION      MEDICATIONS:  Prior to Admission medications   Medication Sig Start Date End Date Taking? Authorizing Provider  albuterol (VENTOLIN HFA) 108 (90 Base) MCG/ACT inhaler Inhale 2 puffs into the lungs every 4 (four) hours as needed for wheezing or shortness of breath. 05/03/19   Triplett, Rulon Eisenmenger B, FNP  HYDROcodone-acetaminophen (NORCO) 5-325 MG tablet Take 1 tablet by mouth every 4 (four) hours as needed for moderate pain. 04/09/22   Merwyn Katos, MD  Ipratropium-Albuterol (COMBIVENT) 20-100 MCG/ACT AERS respimat Inhale 1 puff into the lungs every 6 (six) hours. 05/03/19   Triplett, Rulon Eisenmenger B, FNP  ondansetron (ZOFRAN-ODT) 4 MG disintegrating tablet Take 1 tablet (4 mg total) by mouth every 8 (eight) hours as needed for nausea or vomiting. 05/27/21   Tommi Rumps, PA-C    Physical Exam   Triage Vital Signs: ED Triage Vitals  Enc Vitals Group     BP 04/14/22 2252 123/84     Pulse Rate 04/14/22 2252 94     Resp 04/14/22 2252 17     Temp 04/14/22 2252 98.4 F (36.9 C)     Temp Source 04/14/22 2252 Oral     SpO2 04/14/22 2252 99 %     Weight 04/14/22 2255 205 lb (93 kg)     Height  04/14/22 2255 5' (1.524 m)     Head Circumference --      Peak Flow --      Pain Score 04/14/22 2255 7     Pain Loc --      Pain Edu? --      Excl. in [place]? --     Most recent vital signs: Vitals:   04/15/22 0204 04/15/22 0308  BP: 129/69   Pulse: 80 78  Resp: 18 16  Temp:    SpO2: 99% 100%    CONSTITUTIONAL: Alert and oriented and responds appropriately to questions. Well-appearing; well-nourished HEAD: Normocephalic, atraumatic EYES: Conjunctivae clear, pupils appear equal, sclera nonicteric ENT: normal nose; moist mucous membranes NECK: Supple, normal ROM CARD: RRR; S1 and S2 appreciated; no murmurs, no clicks, no rubs, no gallops RESP: Normal chest excursion without splinting or tachypnea; breath sounds clear and equal bilaterally; no wheezes, no rhonchi, no rales, no hypoxia or respiratory distress, speaking full sentences ABD/GI: Normal bowel sounds; non-distended; soft, non-tender, no rebound, no guarding, no peritoneal signs BACK: The back appears normal EXT: Normal ROM in all joints; no deformity noted, no edema; no cyanosis SKIN: Normal color for age and race; warm; no rash on exposed skin NEURO: Moves all extremities equally, normal speech PSYCH: The patient's mood and manner  are appropriate.   ED Results / Procedures / Treatments   LABS: (all labs ordered are listed, but only abnormal results are displayed) Labs Reviewed  URINALYSIS, ROUTINE W REFLEX MICROSCOPIC - Abnormal; Notable for the following components:      Result Value   Color, Urine YELLOW (*)    APPearance CLEAR (*)    All other components within normal limits  CBC WITH DIFFERENTIAL/PLATELET - Abnormal; Notable for the following components:   MCH 25.8 (*)    All other components within normal limits  COMPREHENSIVE METABOLIC PANEL - Abnormal; Notable for the following components:   Glucose, Bld 141 (*)    AST 12 (*)    All other components within normal limits  URINE CULTURE  LIPASE, BLOOD   HCG, QUANTITATIVE, PREGNANCY  POC URINE PREG, ED     EKG:  RADIOLOGY: My personal review and interpretation of imaging: Ultrasound and CT scan showed no acute abnormality.  Normal appearing ovaries.  No appendicitis.  I have personally reviewed all radiology reports.   CT Renal Stone Study  Result Date: 04/15/2022 CLINICAL DATA:  Right lower quadrant pain, nausea/vomiting EXAM: CT ABDOMEN AND PELVIS WITHOUT CONTRAST TECHNIQUE: Multidetector CT imaging of the abdomen and pelvis was performed following the standard protocol without IV contrast. RADIATION DOSE REDUCTION: This exam was performed according to the departmental dose-optimization program which includes automated exposure control, adjustment of the mA and/or kV according to patient size and/or use of iterative reconstruction technique. COMPARISON:  Pelvic ultrasound dated 04/14/2022. CT abdomen/pelvis dated 04/09/2022. FINDINGS: Lower chest: Lung bases are clear. Hepatobiliary: Unenhanced liver is unremarkable. Gallbladder is unremarkable. No intrahepatic or extrahepatic ductal dilatation. Pancreas: Within normal limits. Spleen: Within normal limits. Adrenals/Urinary Tract: Adrenal glands are within normal limits. Kidneys are within normal limits. No renal calculi or hydronephrosis. Bladder is within normal limits. Stomach/Bowel: Stomach within normal limits. No evidence of bowel obstruction. Normal appendix (series 4/image 4). No colonic wall thickening or inflammatory changes. Vascular/Lymphatic: No evidence of abdominal aortic aneurysm. No suspicious abdominopelvic lymphadenopathy. Reproductive: Uterus is normal limits. Ovaries are within normal limits, noting a physiologic right ovarian cyst which was better evaluated on recent ultrasound. Other: No abdominopelvic ascites. Musculoskeletal: Visualized osseous structures are within normal limits. IMPRESSION: Negative CT abdomen/pelvis.  No interval change from recent CT. Electronically  Signed   By: Charline Bills M.D.   On: 04/15/2022 01:21   US PELVIC COMPLETE W TRANSVAGINAL AND TORSION R/O  Result Date: 04/14/2022 CLINICAL DATA:  Pelvic pain EXAM: TRANSABDOMINAL AND TRANSVAGINAL ULTRASOUND OF PELVIS DOPPLER ULTRASOUND OF OVARIES TECHNIQUE: Both transabdominal and transvaginal ultrasound examinations of the pelvis were performed. Transabdominal technique was performed for global imaging of the pelvis including uterus, ovaries, adnexal regions, and pelvic cul-de-sac. It was necessary to proceed with endovaginal exam following the transabdominal exam to visualize the uterus endometrium ovaries. Color and duplex Doppler ultrasound was utilized to evaluate blood flow to the ovaries. COMPARISON:  CT 04/09/2022 FINDINGS: Uterus Measurements: 6.2 x 3.7 x 4.3 cm = volume: 52.1 mL. No fibroids or other mass visualized. Endometrium Thickness: 5.5 mm.  No focal abnormality visualized. Right ovary Measurements: 3.4 x 3.4 x 3.2 cm = volume: 19.2 mL. Cyst or dominant follicle measuring 2.5 cm, no imaging follow-up is recommended Left ovary Measurements: 2.6 x 1.8 x 1.6 cm = volume: 3.2 mL. Normal appearance/no adnexal mass. Pulsed Doppler evaluation of both ovaries demonstrates normal low-resistance arterial and venous waveforms. Other findings No abnormal free fluid. IMPRESSION: Negative pelvic ultrasound. No  evidence for ovarian torsion. Electronically Signed   By: Jasmine Pang M.D.   On: 04/14/2022 23:52     PROCEDURES:  Critical Care performed: No    Procedures    IMPRESSION / MDM / ASSESSMENT AND PLAN / ED COURSE  I reviewed the triage vital signs and the nursing notes.    Patient here with right lower quadrant abdominal pain, nausea and vomiting.     DIFFERENTIAL DIAGNOSIS (includes but not limited to):   Torsion, TOA, ovarian cyst, ectopic, appendicitis, colitis, kidney stone, UTI, pyelonephritis   Patient's presentation is most consistent with acute presentation with  potential threat to life or bodily function.   PLAN: Workup initiated from triage.  No leukocytosis, normal renal function, LFTs and lipase.  Pregnancy test negative.  Urine shows no sign of infection or blood.  Ultrasound was obtained first from triage and reviewed and interpreted by myself and the radiologist and showed normal blood flow to both ovaries but does show small cyst versus dominant follicle to the right ovary.  CT of the abdomen pelvis was also obtained and reviewed/interpreted by myself and the radiologist and shows no appendicitis, kidney stone.  Will give Zofran.  Patient reports she is already feeling better.  She does report having some pressure at the end of urination.  Will add on urine culture although urinalysis is normal.  Do not feel she needs antibiotics today.  Recommended Tylenol, Motrin over-the-counter as needed.   MEDICATIONS GIVEN IN ED: Medications  ondansetron (ZOFRAN-ODT) disintegrating tablet 4 mg (4 mg Oral Given 04/15/22 0305)     ED COURSE:  At this time, I do not feel there is any life-threatening condition present. I reviewed all nursing notes, vitals, pertinent previous records.  All lab and urine results, EKGs, imaging ordered have been independently reviewed and interpreted by myself.  I reviewed all available radiology reports from any imaging ordered this visit.  Based on my assessment, I feel the patient is safe to be discharged home without further emergent workup and can continue workup as an outpatient as needed. Discussed all findings, treatment plan as well as usual and customary return precautions.  They verbalize understanding and are comfortable with this plan.  Outpatient follow-up has been provided as needed.  All questions have been answered.    CONSULTS:  none   OUTSIDE RECORDS REVIEWED: Reviewed last family medicine visit on 02/08/2022.       FINAL CLINICAL IMPRESSION(S) / ED DIAGNOSES   Final diagnoses:  RLQ abdominal pain   Nausea and vomiting in adult  Right ovarian cyst     Rx / DC Orders   ED Discharge Orders          Ordered    ondansetron (ZOFRAN-ODT) 4 MG disintegrating tablet  Every 6 hours PRN        04/15/22 0223             Note:  This document was prepared using Dragon voice recognition software and may include unintentional dictation errors.   Izekiel Flegel, Layla Maw, DO 04/15/22 [phone]

## 2022-04-15 NOTE — ED Notes (Signed)
Pt brought to Hallway 5 at this time, this RN now assuming care.

## 2022-04-15 NOTE — ED Notes (Signed)
Patient called with no answer. °

## 2022-04-15 NOTE — Discharge Instructions (Addendum)
You may alternate Tylenol 1000 mg every 6 hours as needed for pain, fever and Ibuprofen 800 mg every 6-8 hours as needed for pain, fever.  Please take Ibuprofen with food.  Do not take more than 4000 mg of Tylenol (acetaminophen) in a 24 hour period. ° °

## 2022-04-16 LAB — URINE CULTURE

## 2022-06-07 ENCOUNTER — Other Ambulatory Visit: Payer: Self-pay

## 2022-06-07 ENCOUNTER — Emergency Department: Payer: BLUE CROSS/BLUE SHIELD

## 2022-06-07 ENCOUNTER — Emergency Department
Admission: EM | Admit: 2022-06-07 | Discharge: 2022-06-07 | Disposition: A | Discharge status: Home / Self Care | Payer: BLUE CROSS/BLUE SHIELD | Attending: Student in an Organized Health Care Education/Training Program | Admitting: Student in an Organized Health Care Education/Training Program

## 2022-06-07 DIAGNOSIS — U071 COVID-19: Secondary | ICD-10-CM | POA: Diagnosis not present

## 2022-06-07 DIAGNOSIS — R519 Headache, unspecified: Secondary | ICD-10-CM | POA: Diagnosis present

## 2022-06-07 DIAGNOSIS — B349 Viral infection, unspecified: Secondary | ICD-10-CM

## 2022-06-07 LAB — RESP PANEL BY RT-PCR (RSV, FLU A&B, COVID)  RVPGX2
Influenza A by PCR: NEGATIVE
Influenza B by PCR: NEGATIVE
Resp Syncytial Virus by PCR: NEGATIVE
SARS Coronavirus 2 by RT PCR: POSITIVE — AB

## 2022-06-07 MED ORDER — IBUPROFEN 600 MG PO TABS
600.0000 mg | ORAL_TABLET | Freq: Once | ORAL | Status: AC
  Administered 2022-06-07: 600 mg via ORAL
  Filled 2022-06-07: qty 1

## 2022-06-07 NOTE — ED Provider Notes (Signed)
[facility] Provider Note    Event Date/Time   First MD Initiated Contact with Patient 06/07/22 0805     (approximate)   History   Generalized Body Aches   HPI  Bethany Bray is a 32 y.o. female who presents to the ER for relation of headache congestion productive cough muscle aches myalgias for the past 24 hours.  No known sick contacts but works in a [place].  No nausea or vomiting.  She is a diabetic on Ozempic.  Denies any diarrhea.  Did not take any Motrin or Tylenol today.     Physical Exam   Triage Vital Signs: ED Triage Vitals  Enc Vitals Group     BP 06/07/22 0738 128/86     Pulse Rate 06/07/22 0738 100     Resp 06/07/22 0738 18     Temp 06/07/22 0738 98.9 F (37.2 C)     Temp Source 06/07/22 0738 Oral     SpO2 06/07/22 0738 99 %     Weight --      Height --      Head Circumference --      Peak Flow --      Pain Score 06/07/22 0736 5     Pain Loc --      Pain Edu? --      Excl. in [place]? --     Most recent vital signs: Vitals:   06/07/22 0738  BP: 128/86  Pulse: 100  Resp: 18  Temp: 98.9 F (37.2 C)  SpO2: 99%     Constitutional: Alert  Eyes: Conjunctivae are normal.  Head: Atraumatic. Nose: No congestion/rhinnorhea. Mouth/Throat: Mucous membranes are moist.   Neck: Painless ROM.  Cardiovascular:   Good peripheral circulation. Respiratory: Normal respiratory effort.  No retractions.  Gastrointestinal: Soft and nontender.  Musculoskeletal:  no deformity Neurologic:  MAE spontaneously. No gross focal neurologic deficits are appreciated.  Skin:  Skin is warm, dry and intact. No rash noted. Psychiatric: Mood and affect are normal. Speech and behavior are normal.    ED Results / Procedures / Treatments   Labs (all labs ordered are listed, but only abnormal results are displayed) Labs Reviewed  RESP PANEL BY RT-PCR (RSV, FLU A&B, COVID)  RVPGX2 - Abnormal; Notable for the following components:      Result Value    SARS Coronavirus 2 by RT PCR POSITIVE (*)    All other components within normal limits     EKG     RADIOLOGY Please see ED Course for my review and interpretation.  I personally reviewed all radiographic images ordered to evaluate for the above acute complaints and reviewed radiology reports and findings.  These findings were personally discussed with the patient.  Please see medical record for radiology report.    PROCEDURES:  Critical Care performed: No  Procedures   MEDICATIONS ORDERED IN ED: Medications  ibuprofen (ADVIL) tablet 600 mg (600 mg Oral Given 06/07/22 0817)     IMPRESSION / MDM / ASSESSMENT AND PLAN / ED COURSE  I reviewed the triage vital signs and the nursing notes.                              Differential diagnosis includes, but is not limited to, COVID, flu, RSV, pneumonia  Patient presented to the ER for evaluation of symptoms as described above.  She is afebrile hemodynamically stable well-appearing no acute distress.  Chest  x-ray ordered evaluate for pneumonia on my review and interpretation does not show any evidence of consolidation no pneumothorax.  She did test positive for COVID.  She is well-appearing vaccinated.  Discussed option for Paxlovid but given her mild symptoms I think that deferral and conservative management would be perfectly appropriate and patient agreeable to this plan.  Discussed signs and symptoms for which she should return to the ER.     FINAL CLINICAL IMPRESSION(S) / ED DIAGNOSES   Final diagnoses:  Viral illness  COVID-19     Rx / DC Orders   ED Discharge Orders     None        Note:  This document was prepared using Dragon voice recognition software and may include unintentional dictation errors.    Merlyn Lot, MD 06/07/22 [phone]

## 2022-06-07 NOTE — ED Triage Notes (Signed)
Pt to ED via POV from home. Pt ambulatory to triage. Pt reports fever, chills, body aches and HA since yesterday. Pt reports temp of 100 at home yesterday.

## 2022-07-30 ENCOUNTER — Emergency Department: Payer: BLUE CROSS/BLUE SHIELD

## 2022-07-30 ENCOUNTER — Emergency Department
Admission: EM | Admit: 2022-07-30 | Discharge: 2022-07-30 | Disposition: A | Discharge status: Home / Self Care | Payer: BLUE CROSS/BLUE SHIELD | Attending: Emergency Medicine | Admitting: Emergency Medicine

## 2022-07-30 ENCOUNTER — Encounter: Payer: Self-pay | Admitting: *Deleted

## 2022-07-30 ENCOUNTER — Other Ambulatory Visit: Payer: Self-pay

## 2022-07-30 DIAGNOSIS — J45901 Unspecified asthma with (acute) exacerbation: Secondary | ICD-10-CM | POA: Insufficient documentation

## 2022-07-30 DIAGNOSIS — E119 Type 2 diabetes mellitus without complications: Secondary | ICD-10-CM | POA: Diagnosis not present

## 2022-07-30 DIAGNOSIS — R0602 Shortness of breath: Secondary | ICD-10-CM | POA: Diagnosis present

## 2022-07-30 LAB — BASIC METABOLIC PANEL
Anion gap: 9 (ref 5–15)
BUN: 16 mg/dL (ref 6–20)
CO2: 24 mmol/L (ref 22–32)
Calcium: 9.1 mg/dL (ref 8.9–10.3)
Chloride: 102 mmol/L (ref 98–111)
Creatinine, Ser: 0.78 mg/dL (ref 0.44–1.00)
GFR, Estimated: >60 mL/min (ref >60)
Glucose, Bld: 92 mg/dL (ref 70–99)
Potassium: 3.7 mmol/L (ref 3.5–5.1)
Sodium: 135 mmol/L (ref 135–145)

## 2022-07-30 LAB — CBC
HCT: 38.6 % (ref 36.0–46.0)
Hemoglobin: 12.2 g/dL (ref 12.0–15.0)
MCH: 26.2 pg (ref 26.0–34.0)
MCHC: 31.6 g/dL (ref 30.0–36.0)
MCV: 83 fL (ref 80.0–100.0)
Platelets: 306 10*3/uL (ref 150–400)
RBC: 4.65 MIL/uL (ref 3.87–5.11)
RDW: 13.1 % (ref 11.5–15.5)
WBC: 14.2 10*3/uL — ABNORMAL HIGH (ref 4.0–10.5)
nRBC: 0 % (ref 0.0–0.2)

## 2022-07-30 LAB — TROPONIN I (HIGH SENSITIVITY): Troponin I (High Sensitivity): <2 ng/L (ref <18)

## 2022-07-30 MED ORDER — IPRATROPIUM-ALBUTEROL 0.5-2.5 (3) MG/3ML IN SOLN
3.0000 mL | Freq: Once | RESPIRATORY_TRACT | Status: AC
  Administered 2022-07-30: 3 mL via RESPIRATORY_TRACT
  Filled 2022-07-30: qty 3

## 2022-07-30 MED ORDER — DEXAMETHASONE SODIUM PHOSPHATE 10 MG/ML IJ SOLN
10.0000 mg | Freq: Once | INTRAMUSCULAR | Status: AC
  Administered 2022-07-30: 10 mg via INTRAMUSCULAR
  Filled 2022-07-30: qty 1

## 2022-07-30 MED ORDER — ALBUTEROL SULFATE (2.5 MG/3ML) 0.083% IN NEBU: 2.5000 mg | INHALATION_SOLUTION | Freq: Four times a day (QID) | RESPIRATORY_TRACT | 12 refills | Status: AC | PRN

## 2022-07-30 MED ORDER — PREDNISONE 50 MG PO TABS: ORAL_TABLET | ORAL | 0 refills | Status: AC

## 2022-07-30 MED ORDER — ALBUTEROL SULFATE HFA 108 (90 BASE) MCG/ACT IN AERS: 2.0000 | INHALATION_SPRAY | Freq: Four times a day (QID) | RESPIRATORY_TRACT | 2 refills | Status: AC | PRN

## 2022-07-30 NOTE — ED Provider Notes (Signed)
[facility] Provider Note  Patient Contact: 8:19 PM (approximate)   History   Shortness of Breath and Wheezing   HPI  Bethany Bray is a 32 y.o. female with a history of asthma, anemia and diabetes, presents to the emergency department with wheezing and increased work of breathing that started today.  Patient states that her current symptoms feel similar to asthma exacerbations in the past.  She does endorse current chest tightness.  No nausea, vomiting or abdominal pain.      Physical Exam   Triage Vital Signs: ED Triage Vitals  Enc Vitals Group     BP 07/30/22 1757 122/75     Pulse Rate 07/30/22 1757 92     Resp 07/30/22 1757 (!) 26     Temp 07/30/22 1757 98.3 F (36.8 C)     Temp Source 07/30/22 1757 Oral     SpO2 07/30/22 1757 100 %     Weight 07/30/22 1755 215 lb (97.5 kg)     Height 07/30/22 1755 5' (1.524 m)     Head Circumference --      Peak Flow --      Pain Score 07/30/22 1755 5     Pain Loc --      Pain Edu? --      Excl. in [place]? --     Most recent vital signs: Vitals:   07/30/22 1757  BP: 122/75  Pulse: 92  Resp: (!) 26  Temp: 98.3 F (36.8 C)  SpO2: 100%     General: Alert and in no acute distress. Eyes:  PERRL. EOMI. Head: No acute traumatic findings ENT:      Nose: No congestion/rhinnorhea.      Mouth/Throat: Mucous membranes are moist. Neck: No stridor. No cervical spine tenderness to palpation. Cardiovascular:  Good peripheral perfusion Respiratory: Normal respiratory effort without tachypnea or retractions. Lungs CTAB. Good air entry to the bases with no decreased or absent breath sounds. Gastrointestinal: Bowel sounds 4 quadrants. Soft and nontender to palpation. No guarding or rigidity. No palpable masses. No distention. No CVA tenderness. Musculoskeletal: Full range of motion to all extremities.  Neurologic:  No gross focal neurologic deficits are appreciated.  Skin:   No rash noted    ED Results /  Procedures / Treatments   Labs (all labs ordered are listed, but only abnormal results are displayed) Labs Reviewed  CBC - Abnormal; Notable for the following components:      Result Value   WBC 14.2 (*)    All other components within normal limits  BASIC METABOLIC PANEL  POC URINE PREG, ED  TROPONIN I (HIGH SENSITIVITY)  TROPONIN I (HIGH SENSITIVITY)       RADIOLOGY  I personally viewed and evaluated these images as part of my medical decision making, as well as reviewing the written report by the radiologist.  ED Provider Interpretation: No acute abnormality on chest xray   PROCEDURES:  Critical Care performed: No  Procedures   MEDICATIONS ORDERED IN ED: Medications  ipratropium-albuterol (DUONEB) 0.5-2.5 (3) MG/3ML nebulizer solution 3 mL (3 mLs Nebulization Given 07/30/22 2032)  ipratropium-albuterol (DUONEB) 0.5-2.5 (3) MG/3ML nebulizer solution 3 mL (3 mLs Nebulization Given 07/30/22 2032)  dexamethasone (DECADRON) injection 10 mg (10 mg Intramuscular Given 07/30/22 2031)  ipratropium-albuterol (DUONEB) 0.5-2.5 (3) MG/3ML nebulizer solution 3 mL (3 mLs Nebulization Given 07/30/22 2032)     IMPRESSION / MDM / ASSESSMENT AND PLAN / ED COURSE  I reviewed the triage vital signs  and the nursing notes.                              Assessment and plan: Shortness of breath:  32 year old female presents to the emergency department with shortness of breath and wheezing.  Patient tachypneic at triage but other vital signs reassuring.  On exam, patient alert, active and nontoxic-appearing.  She did have diffuse wheezing auscultated bilaterally which improved in the emergency department after 3 DuoNebs and IM Decadron.  Patient was discharged with albuterol inhaler, nebulized albuterol and a 5-day course of prednisone.  Return precautions were given to return with new or worsening symptoms.     FINAL CLINICAL IMPRESSION(S) / ED DIAGNOSES   Final diagnoses:  Mild asthma  with exacerbation, unspecified whether persistent     Rx / DC Orders   ED Discharge Orders          Ordered    predniSONE (DELTASONE) 50 MG tablet        07/30/22 2132    albuterol (VENTOLIN HFA) 108 (90 Base) MCG/ACT inhaler  Every 6 hours PRN        07/30/22 2136    albuterol (PROVENTIL) (2.5 MG/3ML) 0.083% nebulizer solution  Every 6 hours PRN        07/30/22 2136             Note:  This document was prepared using Dragon voice recognition software and may include unintentional dictation errors.   Vallarie Mare Jemison, Hershal Coria 07/30/22 2141    Harvest Dark, MD 07/30/22 2217

## 2022-07-30 NOTE — Discharge Instructions (Addendum)
You can give 2 and half milligrams of albuterol every 4 hours as needed for wheezing Begin taking 1 tablet of prednisone once daily for the next 5 days.

## 2022-07-30 NOTE — ED Triage Notes (Addendum)
Pt to triage via wheelchair.  Pt reports sob.  Sx began today.  Hx asthma. Pt ran out inhaler 2 days ago.Nonsmoker.  No fever no cough .  Pt tearful

## 2023-01-25 ENCOUNTER — Emergency Department: Payer: Medicaid Other

## 2023-01-25 ENCOUNTER — Other Ambulatory Visit: Payer: Self-pay

## 2023-01-25 ENCOUNTER — Emergency Department
Admission: EM | Admit: 2023-01-25 | Discharge: 2023-01-25 | Disposition: A | Discharge status: Home / Self Care | Payer: Medicaid Other | Attending: Emergency Medicine | Admitting: Emergency Medicine

## 2023-01-25 DIAGNOSIS — R102 Pelvic and perineal pain: Secondary | ICD-10-CM | POA: Insufficient documentation

## 2023-01-25 DIAGNOSIS — J45909 Unspecified asthma, uncomplicated: Secondary | ICD-10-CM | POA: Insufficient documentation

## 2023-01-25 DIAGNOSIS — D72829 Elevated white blood cell count, unspecified: Secondary | ICD-10-CM | POA: Diagnosis not present

## 2023-01-25 DIAGNOSIS — E119 Type 2 diabetes mellitus without complications: Secondary | ICD-10-CM | POA: Insufficient documentation

## 2023-01-25 LAB — URINALYSIS, ROUTINE W REFLEX MICROSCOPIC
Bilirubin Urine: NEGATIVE
Glucose, UA: NEGATIVE mg/dL
Hgb urine dipstick: NEGATIVE
Ketones, ur: NEGATIVE mg/dL
Leukocytes,Ua: NEGATIVE
Nitrite: NEGATIVE
Protein, ur: NEGATIVE mg/dL
Specific Gravity, Urine: 1.025 (ref 1.005–1.030)
pH: 6 (ref 5.0–8.0)

## 2023-01-25 LAB — COMPREHENSIVE METABOLIC PANEL
ALT: 14 U/L (ref 0–44)
AST: 14 U/L — ABNORMAL LOW (ref 15–41)
Albumin: 4 g/dL (ref 3.5–5.0)
Alkaline Phosphatase: 53 U/L (ref 38–126)
Anion gap: 10 (ref 5–15)
BUN: 24 mg/dL — ABNORMAL HIGH (ref 6–20)
CO2: 23 mmol/L (ref 22–32)
Calcium: 9.2 mg/dL (ref 8.9–10.3)
Chloride: 102 mmol/L (ref 98–111)
Creatinine, Ser: 0.75 mg/dL (ref 0.44–1.00)
GFR, Estimated: >60 mL/min (ref >60)
Glucose, Bld: 118 mg/dL — ABNORMAL HIGH (ref 70–99)
Potassium: 4.4 mmol/L (ref 3.5–5.1)
Sodium: 135 mmol/L (ref 135–145)
Total Bilirubin: 0.6 mg/dL (ref 0.3–1.2)
Total Protein: 7.7 g/dL (ref 6.5–8.1)

## 2023-01-25 LAB — CHLAMYDIA/NGC RT PCR (ARMC ONLY)
Chlamydia Tr: NOT DETECTED
N gonorrhoeae: NOT DETECTED

## 2023-01-25 LAB — CBC WITH DIFFERENTIAL/PLATELET
Abs Immature Granulocytes: 0.03 10*3/uL (ref 0.00–0.07)
Basophils Absolute: 0.1 10*3/uL (ref 0.0–0.1)
Basophils Relative: 1 %
Eosinophils Absolute: 0.2 10*3/uL (ref 0.0–0.5)
Eosinophils Relative: 2 %
HCT: 40.8 % (ref 36.0–46.0)
Hemoglobin: 12.9 g/dL (ref 12.0–15.0)
Immature Granulocytes: 0 %
Lymphocytes Relative: 16 %
Lymphs Abs: 2.1 10*3/uL (ref 0.7–4.0)
MCH: 26.3 pg (ref 26.0–34.0)
MCHC: 31.6 g/dL (ref 30.0–36.0)
MCV: 83.3 fL (ref 80.0–100.0)
Monocytes Absolute: 1.1 10*3/uL — ABNORMAL HIGH (ref 0.1–1.0)
Monocytes Relative: 9 %
Neutro Abs: 9.4 10*3/uL — ABNORMAL HIGH (ref 1.7–7.7)
Neutrophils Relative %: 72 %
Platelets: 325 10*3/uL (ref 150–400)
RBC: 4.9 MIL/uL (ref 3.87–5.11)
RDW: 13.5 % (ref 11.5–15.5)
WBC: 13 10*3/uL — ABNORMAL HIGH (ref 4.0–10.5)
nRBC: 0.2 % (ref 0.0–0.2)

## 2023-01-25 LAB — POC URINE PREG, ED: Preg Test, Ur: NEGATIVE

## 2023-01-25 LAB — WET PREP, GENITAL
Clue Cells Wet Prep HPF POC: NONE SEEN
Sperm: NONE SEEN
Trich, Wet Prep: NONE SEEN
WBC, Wet Prep HPF POC: <10 — AB (ref <10)
Yeast Wet Prep HPF POC: NONE SEEN

## 2023-01-25 LAB — LIPASE, BLOOD: Lipase: 33 U/L (ref 11–51)

## 2023-01-25 MED ORDER — IBUPROFEN 800 MG PO TABS
800.0000 mg | ORAL_TABLET | Freq: Once | ORAL | Status: AC
  Administered 2023-01-25: 800 mg via ORAL
  Filled 2023-01-25: qty 1

## 2023-01-25 MED ORDER — HYDROCODONE-ACETAMINOPHEN 5-325 MG PO TABS
2.0000 | ORAL_TABLET | Freq: Once | ORAL | Status: AC
  Administered 2023-01-25: 2 via ORAL
  Filled 2023-01-25: qty 2

## 2023-01-25 MED ORDER — IOHEXOL 300 MG/ML  SOLN
100.0000 mL | Freq: Once | INTRAMUSCULAR | Status: AC | PRN
  Administered 2023-01-25: 100 mL via INTRAVENOUS

## 2023-01-25 MED ORDER — SODIUM CHLORIDE 0.9 % IV BOLUS (SEPSIS)
1000.0000 mL | Freq: Once | INTRAVENOUS | Status: AC
  Administered 2023-01-25: 1000 mL via INTRAVENOUS

## 2023-01-25 MED ORDER — ONDANSETRON 4 MG PO TBDP
4.0000 mg | ORAL_TABLET | Freq: Once | ORAL | Status: AC
  Administered 2023-01-25: 4 mg via ORAL
  Filled 2023-01-25: qty 1

## 2023-01-25 NOTE — ED Triage Notes (Signed)
Pelvic Pain that started about 40 minutes ago. Pt reports some nausea, denies any vomiting. Pt talks in complete sentences no respiratory distress noted

## 2023-01-25 NOTE — Discharge Instructions (Addendum)
You may alternate Tylenol 1000 mg every 6 hours as needed for pain, fever and Ibuprofen 800 mg every 6-8 hours as needed for pain, fever.  Please take Ibuprofen with food.  Do not take more than 4000 mg of Tylenol (acetaminophen) in a 24 hour period.  Your labs, urine, pelvic swabs, CT abdomen pelvis and pelvic ultrasound were normal today.  If you continue to have pain, recommend follow-up with your OB/GYN as an outpatient for further workup.

## 2023-01-25 NOTE — ED Provider Notes (Signed)
[facility] Provider Note    Event Date/Time   First MD Initiated Contact with Patient 01/25/23 0109     (approximate)   History   Abdominal Pain   HPI  Bethany Bray is a 32 y.o. female with history of diabetes, asthma, anemia who presents to the emergency department with complaints of diffuse pelvic pain that started just prior to arrival.  Has had nausea but no vomiting or diarrhea.  No dysuria or hematuria.  No abnormal vaginal bleeding or discharge.  Reports history of a ovarian cyst and states this feels similar.  Has had previous C-section.  Denies any concern for sexually transmitted infection but agrees to testing today.   History provided by patient, significant other.    Past Medical History:  Diagnosis Date   Anemia    Asthma    Diabetes mellitus without complication (HCC)     Past Surgical History:  Procedure Laterality Date   Birth Control Implant Removal     CESAREAN SECTION      MEDICATIONS:  Prior to Admission medications   Medication Sig Start Date End Date Taking? Authorizing Provider  albuterol (PROVENTIL) (2.5 MG/3ML) 0.083% nebulizer solution Take 3 mLs (2.5 mg total) by nebulization every 6 (six) hours as needed for up to 5 days for wheezing or shortness of breath. 07/30/22 08/04/22  Orvil Feil, PA-C  albuterol (VENTOLIN HFA) 108 (90 Base) MCG/ACT inhaler Inhale 2 puffs into the lungs every 6 (six) hours as needed for wheezing or shortness of breath. 07/30/22   Orvil Feil, PA-C  HYDROcodone-acetaminophen (NORCO) 5-325 MG tablet Take 1 tablet by mouth every 4 (four) hours as needed for moderate pain. 04/09/22   Merwyn Katos, MD  Ipratropium-Albuterol (COMBIVENT) 20-100 MCG/ACT AERS respimat Inhale 1 puff into the lungs every 6 (six) hours. 05/03/19   Triplett, Rulon Eisenmenger B, FNP  ondansetron (ZOFRAN-ODT) 4 MG disintegrating tablet Take 1 tablet (4 mg total) by mouth every 6 (six) hours as needed for nausea or vomiting.  04/15/22   Bryttani Blew, Layla Maw, DO  predniSONE (DELTASONE) 50 MG tablet Take one tablet daily for the next five days. 07/30/22   Orvil Feil, PA-C    Physical Exam   Triage Vital Signs: ED Triage Vitals  Encounter Vitals Group     BP 01/25/23 0044 135/84     Systolic BP Percentile --      Diastolic BP Percentile --      Pulse Rate 01/25/23 0044 (!) 123     Resp 01/25/23 0044 (!) 22     Temp 01/25/23 0044 98.4 F (36.9 C)     Temp src --      SpO2 01/25/23 0044 100 %     Weight 01/25/23 0044 211 lb (95.7 kg)     Height 01/25/23 0044 5' (1.524 m)     Head Circumference --      Peak Flow --      Pain Score 01/25/23 0043 10     Pain Loc --      Pain Education --      Exclude from Growth Chart --     Most recent vital signs: Vitals:   01/25/23 0044 01/25/23 0333  BP: 135/84 (!) 99/52  Pulse: (!) 123 89  Resp: (!) 22 16  Temp: 98.4 F (36.9 C) 97.9 F (36.6 C)  SpO2: 100% 100%    CONSTITUTIONAL: Alert, responds appropriately to questions. Well-appearing; well-nourished, in no apparent distress HEAD: Normocephalic, atraumatic  EYES: Conjunctivae clear, pupils appear equal, sclera nonicteric ENT: normal nose; moist mucous membranes NECK: Supple, normal ROM CARD: Regular and tachycardic; S1 and S2 appreciated RESP: Normal chest excursion without splinting or tachypnea; breath sounds clear and equal bilaterally; no wheezes, no rhonchi, no rales, no hypoxia or respiratory distress, speaking full sentences ABD/GI: Non-distended; soft, tender throughout the pelvic area without guarding or rebound BACK: The back appears normal EXT: Normal ROM in all joints; no deformity noted, no edema SKIN: Normal color for age and race; warm; no rash on exposed skin NEURO: Moves all extremities equally, normal speech PSYCH: The patient's mood and manner are appropriate.   ED Results / Procedures / Treatments   LABS: (all labs ordered are listed, but only abnormal results are  displayed) Labs Reviewed  WET PREP, GENITAL - Abnormal; Notable for the following components:      Result Value   WBC, Wet Prep HPF POC <10 (*)    All other components within normal limits  URINALYSIS, ROUTINE W REFLEX MICROSCOPIC - Abnormal; Notable for the following components:   Color, Urine YELLOW (*)    APPearance CLEAR (*)    All other components within normal limits  CBC WITH DIFFERENTIAL/PLATELET - Abnormal; Notable for the following components:   WBC 13.0 (*)    Neutro Abs 9.4 (*)    Monocytes Absolute 1.1 (*)    All other components within normal limits  COMPREHENSIVE METABOLIC PANEL - Abnormal; Notable for the following components:   Glucose, Bld 118 (*)    BUN 24 (*)    AST 14 (*)    All other components within normal limits  CHLAMYDIA/NGC RT PCR ([facility] ONLY)            LIPASE, BLOOD  POC URINE PREG, ED     EKG:   RADIOLOGY: My personal review and interpretation of imaging: Pelvic ultrasound and CT abdomen pelvis unremarkable.  I have personally reviewed all radiology reports.   CT ABDOMEN PELVIS W CONTRAST  Result Date: 01/25/2023 CLINICAL DATA:  32 year old female with acute onset abdominal pain. Nausea. EXAM: CT ABDOMEN AND PELVIS WITH CONTRAST TECHNIQUE: Multidetector CT imaging of the abdomen and pelvis was performed using the standard protocol following bolus administration of intravenous contrast. RADIATION DOSE REDUCTION: This exam was performed according to the departmental dose-optimization program which includes automated exposure control, adjustment of the mA and/or kV according to patient size and/or use of iterative reconstruction technique. CONTRAST:  OMNIPAQUE IOHEXOL 300 MG/ML  SOLN COMPARISON:  Noncontrast CT Abdomen and Pelvis 04/15/2022, CT with contrast 04/09/2022 and 05/14/2018. FINDINGS: Lower chest: Heart size at the upper limits of normal. Lung bases are clear. No pericardial or pleural effusion. Hepatobiliary: Negative liver and  gallbladder. Pancreas: Negative. Spleen: Negative. Adrenals/Urinary Tract: Negative, adrenal glands and kidneys appear symmetric and normal. No nephrolithiasis or hydronephrosis. Decompressed ureters. Unremarkable bladder. Occasional pelvic phleboliths. Stomach/Bowel: Negative large bowel, with normal appendix tracking to the midline from the cecum on series 2, image 60 and coronal image 50. Terminal ileum within normal limits. No dilated small bowel. Stomach and duodenum appear negative. No free air, free fluid, or mesenteric inflammation identified. No abdominal wall hernia identified. Vascular/Lymphatic: Major vascular structures in the abdomen and pelvis appear patent and normal. No lymphadenopathy. Reproductive: Within normal limits limits; chronic mild asymmetry of the left gonadal vein, left parametrial veins appears stable since 2020 and significance is doubtful. Other: No pelvis free fluid. Incidental chronic piercing of the external genitalia. Musculoskeletal: Negative. IMPRESSION:  Stable and essentially negative CT appearance of the abdomen and pelvis. Normal appendix. Electronically Signed   By: Odessa Fleming M.D.   On: 01/25/2023 04:33   US PELVIC TRANSABD W/PELVIC DOPPLER  Result Date: 01/25/2023 CLINICAL DATA:  Pelvic pain EXAM: TRANSABDOMINAL AND TRANSVAGINAL ULTRASOUND OF PELVIS DOPPLER ULTRASOUND OF OVARIES TECHNIQUE: Both transabdominal and transvaginal ultrasound examinations of the pelvis were performed. Transabdominal technique was performed for global imaging of the pelvis including uterus, ovaries, adnexal regions, and pelvic cul-de-sac. It was necessary to proceed with endovaginal exam following the transabdominal exam to visualize the ovaries and endometrium. Color and duplex Doppler ultrasound was utilized to evaluate blood flow to the ovaries. COMPARISON:  CT abdomen and pelvis 04/15/2022. Ultrasound pelvis 04/14/2022 FINDINGS: Uterus Measurements: 8 x 3.8 x 4.6 cm = volume: 72 mL. No  fibroids or other mass visualized. Endometrium Thickness: 5 mm.  No focal abnormality visualized. Right ovary Measurements: 2.4 x 1.5 x 1.8 cm = volume: 3 mL. Normal appearance/no adnexal mass. Left ovary Measurements: 2.6 x 1.9 x 1.8 cm = volume: 5 mL. Normal appearance/no adnexal mass. Pulsed Doppler evaluation of both ovaries demonstrates normal low-resistance arterial and venous waveforms. Other findings No abnormal free fluid. IMPRESSION: Normal ultrasound appearance of the uterus and ovaries. No evidence of ovarian mass or torsion. Electronically Signed   By: Burman Nieves M.D.   On: 01/25/2023 02:15     PROCEDURES:  Critical Care performed: No   CRITICAL CARE Performed by: Baxter Hire Kess Mcilwain   Total critical care time: 0 minutes  Critical care time was exclusive of separately billable procedures and treating other patients.  Critical care was necessary to treat or prevent imminent or life-threatening deterioration.  Critical care was time spent personally by me on the following activities: development of treatment plan with patient and/or surrogate as well as nursing, discussions with consultants, evaluation of patient's response to treatment, examination of patient, obtaining history from patient or surrogate, ordering and performing treatments and interventions, ordering and review of laboratory studies, ordering and review of radiographic studies, pulse oximetry and re-evaluation of patient's condition.   Procedures    IMPRESSION / MDM / ASSESSMENT AND PLAN / ED COURSE  I reviewed the triage vital signs and the nursing notes.    Patient here with pelvic pain.  The patient is on the cardiac monitor to evaluate for evidence of arrhythmia and/or significant heart rate changes.   DIFFERENTIAL DIAGNOSIS (includes but not limited to):   Ovarian cyst, TOA, torsion, ectopic, UTI, appendicitis, colitis, diverticulitis   Patient's presentation is most consistent with acute  presentation with potential threat to life or bodily function.   PLAN: Will obtain urinalysis, pelvic swabs, transvaginal ultrasound with Doppler.  Will give pain and nausea medicine.   MEDICATIONS GIVEN IN ED: Medications  HYDROcodone-acetaminophen (NORCO/VICODIN) 5-325 MG per tablet 2 tablet (2 tablets Oral Given 01/25/23 0145)  ibuprofen (ADVIL) tablet 800 mg (800 mg Oral Given 01/25/23 0145)  ondansetron (ZOFRAN-ODT) disintegrating tablet 4 mg (4 mg Oral Given 01/25/23 0146)  sodium chloride 0.9 % bolus 1,000 mL (0 mLs Intravenous Stopped 01/25/23 0427)  iohexol (OMNIPAQUE) 300 MG/ML solution 100 mL (100 mLs Intravenous Contrast Given 01/25/23 0406)     ED COURSE: Transvaginal ultrasound reviewed and interpreted by myself and the radiologist and shows no acute abnormality.  Normal blood flow to both ovaries.  Urine shows no sign of infection.  Wet prep unremarkable.  Given patient's pain and tachycardia, will proceed with lab work and CT of  the abdomen pelvis for further evaluation.  4:39 AM  Her heart rate has improved.  Pain has been well-controlled.  Patient is a slight leukocytosis which appears chronic for her.  Otherwise normal electrolytes, creatinine, LFTs, lipase.  Gonorrhea and Chlamydia negative.  CT abdomen pelvis reviewed and interpreted by myself and the radiologist and shows no acute abnormality, normal appendix.  Low suspicion clinically for PID given negative pelvic swabs and no discharge.  Will have her follow-up with her OB/GYN.  Per their records it appears she has had pelvic pain previously.  This could be endometriosis.  Recommended Tylenol, Motrin over-the-counter as needed.  At this time, I do not feel there is any life-threatening condition present. I reviewed all nursing notes, vitals, pertinent previous records.  All lab and urine results, EKGs, imaging ordered have been independently reviewed and interpreted by myself.  I reviewed all available radiology reports from  any imaging ordered this visit.  Based on my assessment, I feel the patient is safe to be discharged home without further emergent workup and can continue workup as an outpatient as needed. Discussed all findings, treatment plan as well as usual and customary return precautions.  They verbalize understanding and are comfortable with this plan.  Outpatient follow-up has been provided as needed.  All questions have been answered.  CONSULTS:  none   OUTSIDE RECORDS REVIEWED: Reviewed last [facility] note on 08/16/2022.       FINAL CLINICAL IMPRESSION(S) / ED DIAGNOSES   Final diagnoses:  Pelvic pain in female     Rx / DC Orders   ED Discharge Orders     None        Note:  This document was prepared using Dragon voice recognition software and may include unintentional dictation errors.   Shaunak Kreis, Layla Maw, DO 01/25/23 [phone]

## 2023-05-14 ENCOUNTER — Ambulatory Visit: Payer: Self-pay

## 2023-05-15 ENCOUNTER — Encounter: Payer: Self-pay | Admitting: Obstetrics and Gynecology

## 2023-06-29 NOTE — Progress Notes (Deleted)
 Chilton Greathouse, NP   No chief complaint on file.   HPI:      Ms. Bethany Bray is a 33 y.o. G2P0 whose LMP was No LMP recorded. (Menstrual status: Irregular Periods)., presents today for NP eval of pelvic pain, referred by  Neg abd/pelvic CT 9/24   There are no active problems to display for this patient.   Past Surgical History:  Procedure Laterality Date   Birth Control Implant Removal     CESAREAN SECTION      No family history on file.  Social History   Socioeconomic History   Marital status: Single    Spouse name: Not on file   Number of children: Not on file   Years of education: Not on file   Highest education level: Not on file  Occupational History   Not on file  Tobacco Use   Smoking status: Never   Smokeless tobacco: Never  Vaping Use   Vaping status: Never Used  Substance and Sexual Activity   Alcohol use: Yes    Comment: Occ   Drug use: No   Sexual activity: Yes  Other Topics Concern   Not on file  Social History Narrative   Not on file   Social Drivers of Health   Financial Resource Strain: Not on file  Food Insecurity: Not on file  Transportation Needs: No Transportation Needs (05/17/2022)   Received from [facility], [facility]   [facility] - Transportation    Lack of Transportation (Medical): No    Lack of Transportation (Non-Medical): No  Physical Activity: Insufficiently Active (08/16/2022)   Received from [facility]   Exercise Vital Sign    Days of Exercise per Week: 2 days    Minutes of Exercise per Session: 30 min  Stress: Not on file  Social Connections: Not on file  Intimate Partner Violence: Not At Risk (08/16/2022)   Received from [facility]   Humiliation, Afraid, Rape, and Kick questionnaire    Fear of Current or Ex-Partner: No    Emotionally Abused: No    Physically Abused: No    Sexually Abused: No    Outpatient Medications Prior to Visit  Medication Sig Dispense Refill   albuterol  (PROVENTIL) (2.5 MG/3ML) 0.083% nebulizer solution Take 3 mLs (2.5 mg total) by nebulization every 6 (six) hours as needed for up to 5 days for wheezing or shortness of breath. 75 mL 12   albuterol (VENTOLIN HFA) 108 (90 Base) MCG/ACT inhaler Inhale 2 puffs into the lungs every 6 (six) hours as needed for wheezing or shortness of breath. 8 g 2   HYDROcodone-acetaminophen (NORCO) 5-325 MG tablet Take 1 tablet by mouth every 4 (four) hours as needed for moderate pain. 6 tablet 0   Ipratropium-Albuterol (COMBIVENT) 20-100 MCG/ACT AERS respimat Inhale 1 puff into the lungs every 6 (six) hours. 4 g 0   ondansetron (ZOFRAN-ODT) 4 MG disintegrating tablet Take 1 tablet (4 mg total) by mouth every 6 (six) hours as needed for nausea or vomiting. 20 tablet 0   predniSONE (DELTASONE) 50 MG tablet Take one tablet daily for the next five days. 5 tablet 0   No facility-administered medications prior to visit.      ROS:  Review of Systems BREAST: No symptoms   OBJECTIVE:   Vitals:  There were no vitals taken for this visit.  Physical Exam  Results: No results found for this or any previous visit (from the past 24  hours).   Assessment/Plan: No diagnosis found.    No orders of the defined types were placed in this encounter.     No follow-ups on file.  Sherill Wegener B. Holston Oyama, PA-C 06/29/2023 10:06 PM

## 2023-06-30 ENCOUNTER — Encounter: Payer: Medicaid Other | Admitting: Obstetrics and Gynecology

## 2023-06-30 DIAGNOSIS — R102 Pelvic and perineal pain: Secondary | ICD-10-CM

## 2023-06-30 DIAGNOSIS — Z124 Encounter for screening for malignant neoplasm of cervix: Secondary | ICD-10-CM

## 2023-06-30 DIAGNOSIS — Z1151 Encounter for screening for human papillomavirus (HPV): Secondary | ICD-10-CM

## 2023-07-02 ENCOUNTER — Encounter: Payer: Self-pay | Admitting: Obstetrics and Gynecology

## 2023-10-28 ENCOUNTER — Emergency Department

## 2023-10-28 ENCOUNTER — Emergency Department
Admission: EM | Admit: 2023-10-28 | Discharge: 2023-10-28 | Disposition: A | Discharge status: Home / Self Care | Attending: Emergency Medicine | Admitting: Emergency Medicine

## 2023-10-28 ENCOUNTER — Other Ambulatory Visit: Payer: Self-pay

## 2023-10-28 DIAGNOSIS — R0602 Shortness of breath: Secondary | ICD-10-CM | POA: Insufficient documentation

## 2023-10-28 DIAGNOSIS — E871 Hypo-osmolality and hyponatremia: Secondary | ICD-10-CM | POA: Insufficient documentation

## 2023-10-28 DIAGNOSIS — R079 Chest pain, unspecified: Secondary | ICD-10-CM | POA: Insufficient documentation

## 2023-10-28 DIAGNOSIS — E119 Type 2 diabetes mellitus without complications: Secondary | ICD-10-CM | POA: Insufficient documentation

## 2023-10-28 DIAGNOSIS — J45909 Unspecified asthma, uncomplicated: Secondary | ICD-10-CM | POA: Diagnosis not present

## 2023-10-28 DIAGNOSIS — R739 Hyperglycemia, unspecified: Secondary | ICD-10-CM

## 2023-10-28 LAB — COMPREHENSIVE METABOLIC PANEL WITH GFR
ALT: 15 U/L (ref 0–44)
AST: 15 U/L (ref 15–41)
Albumin: 3.5 g/dL (ref 3.5–5.0)
Alkaline Phosphatase: 57 U/L (ref 38–126)
Anion gap: 8 (ref 5–15)
BUN: 14 mg/dL (ref 6–20)
CO2: 24 mmol/L (ref 22–32)
Calcium: 9.2 mg/dL (ref 8.9–10.3)
Chloride: 100 mmol/L (ref 98–111)
Creatinine, Ser: 0.67 mg/dL (ref 0.44–1.00)
GFR, Estimated: >60 mL/min (ref >60)
Glucose, Bld: 415 mg/dL — ABNORMAL HIGH (ref 70–99)
Potassium: 4 mmol/L (ref 3.5–5.1)
Sodium: 132 mmol/L — ABNORMAL LOW (ref 135–145)
Total Bilirubin: 0.8 mg/dL (ref 0.0–1.2)
Total Protein: 6.7 g/dL (ref 6.5–8.1)

## 2023-10-28 LAB — CBC
HCT: 37.8 % (ref 36.0–46.0)
Hemoglobin: 12.2 g/dL (ref 12.0–15.0)
MCH: 25.7 pg — ABNORMAL LOW (ref 26.0–34.0)
MCHC: 32.3 g/dL (ref 30.0–36.0)
MCV: 79.7 fL — ABNORMAL LOW (ref 80.0–100.0)
Platelets: 272 10*3/uL (ref 150–400)
RBC: 4.74 MIL/uL (ref 3.87–5.11)
RDW: 12.5 % (ref 11.5–15.5)
WBC: 8.1 10*3/uL (ref 4.0–10.5)
nRBC: 0 % (ref 0.0–0.2)

## 2023-10-28 LAB — TROPONIN I (HIGH SENSITIVITY)
Troponin I (High Sensitivity): <2 ng/L (ref <18)
Troponin I (High Sensitivity): 2 ng/L (ref <18)

## 2023-10-28 LAB — BRAIN NATRIURETIC PEPTIDE: B Natriuretic Peptide: 12.4 pg/mL (ref 0.0–100.0)

## 2023-10-28 LAB — LIPASE, BLOOD: Lipase: 33 U/L (ref 11–51)

## 2023-10-28 MED ORDER — FAMOTIDINE IN NACL 20-0.9 MG/50ML-% IV SOLN
20.0000 mg | Freq: Once | INTRAVENOUS | Status: AC
  Administered 2023-10-28: 20 mg via INTRAVENOUS
  Filled 2023-10-28: qty 50

## 2023-10-28 MED ORDER — KETOROLAC TROMETHAMINE 30 MG/ML IJ SOLN
15.0000 mg | Freq: Once | INTRAMUSCULAR | Status: AC
  Administered 2023-10-28: 15 mg via INTRAVENOUS
  Filled 2023-10-28: qty 1

## 2023-10-28 MED ORDER — SODIUM CHLORIDE 0.9 % IV BOLUS
1000.0000 mL | Freq: Once | INTRAVENOUS | Status: AC
  Administered 2023-10-28: 1000 mL via INTRAVENOUS

## 2023-10-28 NOTE — ED Notes (Signed)
 Patient transported to X-ray

## 2023-10-28 NOTE — ED Provider Notes (Signed)
 [facility] Provider Note    Event Date/Time   First MD Initiated Contact with Patient 10/28/23 0244     (approximate)   History   Chest Pain   HPI  Bethany Bray is a 33 y.o. female who presents to the ED from home with a chief complaint of chest pain which awoke her from sleep.  Reports pain associated with some shortness of breath and exacerbated with movement.  Denies recent fever/chills, abdominal pain, nausea/vomiting, palpitations or dizziness.  Denies recent travel, trauma or hormone use.     Past Medical History   Past Medical History:  Diagnosis Date   Anemia    Asthma    Diabetes mellitus without complication (HCC)      Active Problem List  There are no active problems to display for this patient.    Past Surgical History   Past Surgical History:  Procedure Laterality Date   Birth Control Implant Removal     CESAREAN SECTION       Home Medications   Prior to Admission medications   Medication Sig Start Date End Date Taking? Authorizing Provider  albuterol  (PROVENTIL ) (2.5 MG/3ML) 0.083% nebulizer solution Take 3 mLs (2.5 mg total) by nebulization every 6 (six) hours as needed for up to 5 days for wheezing or shortness of breath. 07/30/22 08/04/22  Woods, Jaclyn M, PA-C  albuterol  (VENTOLIN  HFA) 108 (90 Base) MCG/ACT inhaler Inhale 2 puffs into the lungs every 6 (six) hours as needed for wheezing or shortness of breath. 07/30/22   Woods, Jaclyn M, PA-C  HYDROcodone -acetaminophen  (NORCO) 5-325 MG tablet Take 1 tablet by mouth every 4 (four) hours as needed for moderate pain. 04/09/22   Bradler, Evan K, MD  Ipratropium-Albuterol  (COMBIVENT) 20-100 MCG/ACT AERS respimat Inhale 1 puff into the lungs every 6 (six) hours. 05/03/19   Triplett, Cari B, FNP  ondansetron  (ZOFRAN -ODT) 4 MG disintegrating tablet Take 1 tablet (4 mg total) by mouth every 6 (six) hours as needed for nausea or vomiting. 04/15/22   Ward, [name], DO  predniSONE   (DELTASONE ) 50 MG tablet Take one tablet daily for the next five days. 07/30/22   Woods, Jaclyn M, PA-C     Allergies  Patient has no known allergies.   Family History  History reviewed. No pertinent family history.   Physical Exam  Triage Vital Signs: ED Triage Vitals  Encounter Vitals Group     BP 10/28/23 0238 (!) 128/90     Girls Systolic BP Percentile --      Girls Diastolic BP Percentile --      Boys Systolic BP Percentile --      Boys Diastolic BP Percentile --      Pulse Rate 10/28/23 0238 (!) 113     Resp 10/28/23 0238 20     Temp 10/28/23 0238 98.4 F (36.9 C)     Temp src --      SpO2 10/28/23 0238 96 %     Weight 10/28/23 0237 225 lb (102.1 kg)     Height 10/28/23 0237 5' (1.524 m)     Head Circumference --      Peak Flow --      Pain Score 10/28/23 0237 4     Pain Loc --      Pain Education --      Exclude from Growth Chart --     Updated Vital Signs: BP (!) 142/100   Pulse 81   Temp 98.5 F (36.9 C) (  Oral)   Resp 19   Ht 5' (1.524 m)   Wt 102.1 kg   SpO2 98%   BMI 43.94 kg/m    General: Awake, no distress.  CV:  RRR.  Good peripheral perfusion.  Resp:  Normal effort.  CTAB.  Anterior chest tender to palpation. Abd:  Nontender.  No distention.  Other:  No truncal vesicles.   ED Results / Procedures / Treatments  Labs (all labs ordered are listed, but only abnormal results are displayed) Labs Reviewed  CBC - Abnormal; Notable for the following components:      Result Value   MCV 79.7 (*)    MCH 25.7 (*)    All other components within normal limits  COMPREHENSIVE METABOLIC PANEL WITH GFR - Abnormal; Notable for the following components:   Sodium 132 (*)    Glucose, Bld 415 (*)    All other components within normal limits  BRAIN NATRIURETIC PEPTIDE  LIPASE, BLOOD  POC URINE PREG, ED  TROPONIN I (HIGH SENSITIVITY)  TROPONIN I (HIGH SENSITIVITY)     EKG  ED ECG REPORT I, Octavie Westerhold J, the attending physician, personally viewed and  interpreted this ECG.   Date: 10/28/2023  EKG Time: 0240  Rate: 110  Rhythm: sinus tachycardia  Axis: Normal  Intervals:none  ST&T Change: Nonspecific    RADIOLOGY I have independently visualized and interpreted patient's imaging study as well as noted the radiology interpretation:  Chest x-ray: No acute cardiopulmonary process  Official radiology report(s): DG Chest 2 View Result Date: 10/28/2023 CLINICAL DATA:  Chest pain. EXAM: CHEST - 2 VIEW COMPARISON:  Chest x-ray April 2, 24. FINDINGS: The heart size and mediastinal contours are within normal limits. Both lungs are clear. No visible pleural effusions or pneumothorax. No acute osseous abnormality. IMPRESSION: No active cardiopulmonary disease. Electronically Signed   By: Gilmore GORMAN Molt M.D.   On: 10/28/2023 03:56     PROCEDURES:  Critical Care performed: No  .1-3 Lead EKG Interpretation  Performed by: Robinette Vermell PARAS, MD Authorized by: Robinette Vermell PARAS, MD     Interpretation: normal     ECG rate:  98   ECG rate assessment: normal     Rhythm: sinus rhythm     Ectopy: none     Conduction: normal   Comments:     Patient placed on cardiac monitor to evaluate for arrhythmias    MEDICATIONS ORDERED IN ED: Medications  ketorolac  (TORADOL ) 30 MG/ML injection 15 mg (15 mg Intravenous Given 10/28/23 0311)  famotidine (PEPCID) IVPB 20 mg premix (0 mg Intravenous Stopped 10/28/23 0331)  sodium chloride  0.9 % bolus 1,000 mL (0 mLs Intravenous Stopped 10/28/23 0505)     IMPRESSION / MDM / ASSESSMENT AND PLAN / ED COURSE  I reviewed the triage vital signs and the nursing notes.                             33 year old female presenting with chest pain. Differential diagnosis includes, but is not limited to, ACS, aortic dissection, pulmonary embolism, cardiac tamponade, pneumothorax, pneumonia, pericarditis, myocarditis, GI-related causes including esophagitis/gastritis, and musculoskeletal chest wall pain.   I personally reviewed  patient's records and note PCP office visit from 01/31/2023 for follow-up diabetes, morbid obesity, asthma.  Patient's presentation is most consistent with acute complicated illness / injury requiring diagnostic workup.  The patient is on the cardiac monitor to evaluate for evidence of arrhythmia and/or significant heart rate changes.  Will obtain cardiac panel, chest x-ray.  Administer IV ketorolac , Pepcid and reassess.  Clinical Course as of 10/28/23 0539  Tue Oct 28, 2023  0500 Patient resting acute distress.  Repeat troponin remains negative.  Patient will follow-up closely with her PCP.  Strict return precautions given.  Patient verbalizes understanding and agrees with plan of care. [JS]    Clinical Course User Index [JS] Robinette Vermell PARAS, MD     FINAL CLINICAL IMPRESSION(S) / ED DIAGNOSES   Final diagnoses:  Chest pain, unspecified type  Hyperglycemia     Rx / DC Orders   ED Discharge Orders     None        Note:  This document was prepared using Dragon voice recognition software and may include unintentional dictation errors.   Cindi Ghazarian J, MD 10/28/23 [phone]

## 2023-10-28 NOTE — ED Triage Notes (Signed)
 Pt reports chest pain and shortness of breath that woke her from her sleep, pt reports pain and sob worse when she is laying down. Pt states she felt like phlegm was stuck in her throat but denies recent cough or congestion

## 2023-10-28 NOTE — Discharge Instructions (Signed)
Take your medicines daily as directed by your doctor.  Return to the ER for worsening symptoms, persistent vomiting, difficulty breathing or other concerns. 

## 2024-04-02 ENCOUNTER — Other Ambulatory Visit: Payer: Self-pay

## 2024-04-02 DIAGNOSIS — L02214 Cutaneous abscess of groin: Secondary | ICD-10-CM | POA: Insufficient documentation

## 2024-04-02 DIAGNOSIS — J45909 Unspecified asthma, uncomplicated: Secondary | ICD-10-CM | POA: Diagnosis not present

## 2024-04-02 DIAGNOSIS — E1165 Type 2 diabetes mellitus with hyperglycemia: Secondary | ICD-10-CM | POA: Insufficient documentation

## 2024-04-02 NOTE — ED Triage Notes (Signed)
 Pt presents for abscess in right groin. No hx of same. Denies drainage, fevers, chills.

## 2024-04-03 ENCOUNTER — Emergency Department
Admission: EM | Admit: 2024-04-03 | Discharge: 2024-04-03 | Disposition: A | Payer: Self-pay | Attending: Emergency Medicine | Admitting: Emergency Medicine

## 2024-04-03 DIAGNOSIS — R739 Hyperglycemia, unspecified: Secondary | ICD-10-CM

## 2024-04-03 DIAGNOSIS — L0291 Cutaneous abscess, unspecified: Secondary | ICD-10-CM

## 2024-04-03 LAB — BASIC METABOLIC PANEL WITH GFR
Anion gap: 11 (ref 5–15)
BUN: 15 mg/dL (ref 6–20)
CO2: 24 mmol/L (ref 22–32)
Calcium: 9.5 mg/dL (ref 8.9–10.3)
Chloride: 97 mmol/L — ABNORMAL LOW (ref 98–111)
Creatinine, Ser: 0.69 mg/dL (ref 0.44–1.00)
GFR, Estimated: >60 mL/min (ref >60)
Glucose, Bld: 370 mg/dL — ABNORMAL HIGH (ref 70–99)
Potassium: 4.3 mmol/L (ref 3.5–5.1)
Sodium: 132 mmol/L — ABNORMAL LOW (ref 135–145)

## 2024-04-03 LAB — CBC WITH DIFFERENTIAL/PLATELET
Abs Immature Granulocytes: 0.05 K/uL (ref 0.00–0.07)
Basophils Absolute: 0.1 K/uL (ref 0.0–0.1)
Basophils Relative: 0 %
Eosinophils Absolute: 0.2 K/uL (ref 0.0–0.5)
Eosinophils Relative: 2 %
HCT: 40.3 % (ref 36.0–46.0)
Hemoglobin: 12.8 g/dL (ref 12.0–15.0)
Immature Granulocytes: 0 %
Lymphocytes Relative: 23 %
Lymphs Abs: 3 K/uL (ref 0.7–4.0)
MCH: 26.3 pg (ref 26.0–34.0)
MCHC: 31.8 g/dL (ref 30.0–36.0)
MCV: 82.8 fL (ref 80.0–100.0)
Monocytes Absolute: 0.7 K/uL (ref 0.1–1.0)
Monocytes Relative: 6 %
Neutro Abs: 8.9 K/uL — ABNORMAL HIGH (ref 1.7–7.7)
Neutrophils Relative %: 69 %
Platelets: 342 K/uL (ref 150–400)
RBC: 4.87 MIL/uL (ref 3.87–5.11)
RDW: 13.1 % (ref 11.5–15.5)
WBC: 13 K/uL — ABNORMAL HIGH (ref 4.0–10.5)
nRBC: 0 % (ref 0.0–0.2)

## 2024-04-03 LAB — HCG, QUANTITATIVE, PREGNANCY: hCG, Beta Chain, Quant, S: <1 m[IU]/mL (ref <5)

## 2024-04-03 LAB — BLOOD GAS, VENOUS
Acid-Base Excess: 1.2 mmol/L (ref 0.0–2.0)
Bicarbonate: 27.6 mmol/L (ref 20.0–28.0)
O2 Saturation: 78 %
Patient temperature: 37
pCO2, Ven: 50 mmHg (ref 44–60)
pH, Ven: 7.35 (ref 7.25–7.43)
pO2, Ven: 48 mmHg — ABNORMAL HIGH (ref 32–45)

## 2024-04-03 LAB — BETA-HYDROXYBUTYRIC ACID: Beta-Hydroxybutyric Acid: 0.06 mmol/L (ref 0.05–0.27)

## 2024-04-03 LAB — CBG MONITORING, ED
Glucose-Capillary: 279 mg/dL — ABNORMAL HIGH (ref 70–99)
Glucose-Capillary: 352 mg/dL — ABNORMAL HIGH (ref 70–99)

## 2024-04-03 MED ORDER — CEPHALEXIN 500 MG PO CAPS: 500.0000 mg | ORAL_CAPSULE | Freq: Four times a day (QID) | ORAL | 0 refills | Status: DC

## 2024-04-03 MED ORDER — SODIUM CHLORIDE 0.9 % IV BOLUS (SEPSIS)
1000.0000 mL | Freq: Once | INTRAVENOUS | Status: AC
  Administered 2024-04-03: 1000 mL via INTRAVENOUS

## 2024-04-03 MED ORDER — CEPHALEXIN 500 MG PO CAPS: 500.0000 mg | ORAL_CAPSULE | Freq: Four times a day (QID) | ORAL | 0 refills | Status: AC

## 2024-04-03 MED ORDER — LIDOCAINE HCL (PF) 1 % IJ SOLN
5.0000 mL | Freq: Once | INTRAMUSCULAR | Status: AC
  Administered 2024-04-03: 5 mL via INTRADERMAL
  Filled 2024-04-03: qty 5

## 2024-04-03 MED ORDER — GLIPIZIDE 5 MG PO TABS
5.0000 mg | ORAL_TABLET | Freq: Once | ORAL | Status: AC
  Administered 2024-04-03: 5 mg via ORAL
  Filled 2024-04-03: qty 1

## 2024-04-03 MED ORDER — LIDOCAINE-PRILOCAINE 2.5-2.5 % EX CREA
TOPICAL_CREAM | Freq: Once | CUTANEOUS | Status: AC
  Filled 2024-04-03: qty 5

## 2024-04-03 MED ORDER — IBUPROFEN 800 MG PO TABS: 800.0000 mg | ORAL_TABLET | Freq: Three times a day (TID) | ORAL | 0 refills | Status: DC | PRN

## 2024-04-03 MED ORDER — CEPHALEXIN 500 MG PO CAPS
500.0000 mg | ORAL_CAPSULE | Freq: Once | ORAL | Status: AC
  Administered 2024-04-03: 500 mg via ORAL
  Filled 2024-04-03: qty 1

## 2024-04-03 MED ORDER — IBUPROFEN 800 MG PO TABS: 800.0000 mg | ORAL_TABLET | Freq: Three times a day (TID) | ORAL | 0 refills | Status: AC | PRN

## 2024-04-03 NOTE — ED Provider Notes (Signed)
 [facility] Provider Note    Event Date/Time   First MD Initiated Contact with Patient 04/03/24 0002     (approximate)   History   Abscess (/)   HPI  Bethany Bray is a 33 y.o. female with history of non-insulin-dependent diabetes, asthma, anemia who presents to the emergency department with an abscess to her pubic area that is tender and not draining.  No fevers, vomiting or diarrhea.  States she is not sure what her blood glucose has been running and has been noncompliant with her sulfonylurea.  States she was previously on Ozempic but recently changed insurances and has not been able to get authorization yet.   History provided by patient.    Past Medical History:  Diagnosis Date   Anemia    Asthma    Diabetes mellitus without complication (HCC)     Past Surgical History:  Procedure Laterality Date   Birth Control Implant Removal     CESAREAN SECTION      MEDICATIONS:  Prior to Admission medications   Medication Sig Start Date End Date Taking? Authorizing Provider  albuterol  (PROVENTIL ) (2.5 MG/3ML) 0.083% nebulizer solution Take 3 mLs (2.5 mg total) by nebulization every 6 (six) hours as needed for up to 5 days for wheezing or shortness of breath. 07/30/22 08/04/22  Woods, Jaclyn M, PA-C  albuterol  (VENTOLIN  HFA) 108 (90 Base) MCG/ACT inhaler Inhale 2 puffs into the lungs every 6 (six) hours as needed for wheezing or shortness of breath. 07/30/22   Woods, Jaclyn M, PA-C  HYDROcodone -acetaminophen  (NORCO) 5-325 MG tablet Take 1 tablet by mouth every 4 (four) hours as needed for moderate pain. 04/09/22   Bradler, Evan K, MD  Ipratropium-Albuterol  (COMBIVENT) 20-100 MCG/ACT AERS respimat Inhale 1 puff into the lungs every 6 (six) hours. 05/03/19   Triplett, Cari B, FNP  ondansetron  (ZOFRAN -ODT) 4 MG disintegrating tablet Take 1 tablet (4 mg total) by mouth every 6 (six) hours as needed for nausea or vomiting. 04/15/22   Landis Dowdy, [name], DO  predniSONE   (DELTASONE ) 50 MG tablet Take one tablet daily for the next five days. 07/30/22   Woods, Jaclyn M, PA-C    Physical Exam   Triage Vital Signs: ED Triage Vitals  Encounter Vitals Group     BP 04/02/24 2356 128/80     Girls Systolic BP Percentile --      Girls Diastolic BP Percentile --      Boys Systolic BP Percentile --      Boys Diastolic BP Percentile --      Pulse Rate 04/02/24 2355 96     Resp 04/02/24 2355 16     Temp 04/02/24 2355 97.9 F (36.6 C)     Temp Source 04/02/24 2355 Oral     SpO2 04/02/24 2355 96 %     Weight 04/02/24 2355 215 lb (97.5 kg)     Height 04/02/24 2355 5' (1.524 m)     Head Circumference --      Peak Flow --      Pain Score 04/02/24 2355 8     Pain Loc --      Pain Education --      Exclude from Growth Chart --     Most recent vital signs: Vitals:   04/03/24 0330 04/03/24 0350  BP:  116/80  Pulse: 88 91  Resp:  17  Temp:    SpO2: 100% 100%     CONSTITUTIONAL: Alert and responds appropriately to questions.  Well-appearing; well-nourished HEAD: Normocephalic, atraumatic EYES: Conjunctivae clear, pupils appear equal ENT: normal nose; moist mucous membranes NECK: Normal range of motion CARD: Regular rate and rhythm RESP: Normal chest excursion without splinting or tachypnea; no hypoxia or respiratory distress, speaking full sentences ABD/GI: non-distended GU: Small area of fluctuance and tenderness to the left pubic mons without significant surrounding redness, warmth and no crepitus. EXT: Normal ROM in all joints, no major deformities noted SKIN: Normal color for age and race, no rashes on exposed skin NEURO: Moves all extremities equally, normal speech, no facial asymmetry noted PSYCH: The patient's mood and manner are appropriate. Grooming and personal hygiene are appropriate.  ED Results / Procedures / Treatments   LABS: (all labs ordered are listed, but only abnormal results are displayed) Labs Reviewed  CBC WITH  DIFFERENTIAL/PLATELET - Abnormal; Notable for the following components:      Result Value   WBC 13.0 (*)    Neutro Abs 8.9 (*)    All other components within normal limits  BASIC METABOLIC PANEL WITH GFR - Abnormal; Notable for the following components:   Sodium 132 (*)    Chloride 97 (*)    Glucose, Bld 370 (*)    All other components within normal limits  BLOOD GAS, VENOUS - Abnormal; Notable for the following components:   pO2, Ven 48 (*)    All other components within normal limits  CBG MONITORING, ED - Abnormal; Notable for the following components:   Glucose-Capillary 352 (*)    All other components within normal limits  CBG MONITORING, ED - Abnormal; Notable for the following components:   Glucose-Capillary 279 (*)    All other components within normal limits  BETA-HYDROXYBUTYRIC ACID  HCG, QUANTITATIVE, PREGNANCY  URINALYSIS, ROUTINE W REFLEX MICROSCOPIC     EKG:   RADIOLOGY: My personal review and interpretation of imaging:    I have personally reviewed all radiology reports. No results found.   PROCEDURES:  Critical Care performed: No   INCISION AND DRAINAGE Performed by: Josette Zoejane Gaulin Consent: Verbal consent obtained. Risks and benefits: risks, benefits and alternatives were discussed Type: abscess  Body area: Left pubic mons  Anesthesia: local infiltration  Incision was made with a scalpel.  Local anesthetic: lidocaine  1% without epinephrine  Anesthetic total: 4 ml  Complexity: complex Blunt dissection to break up loculations  Drainage: purulent  Drainage amount: Moderate  Packing material: None  Patient tolerance: Patient tolerated the procedure well with no immediate complications.      Procedures    IMPRESSION / MDM / ASSESSMENT AND PLAN / ED COURSE  I reviewed the triage vital signs and the nursing notes.   Patient here with abscess.  Noncompliant with her diabetes medications and is hyperglycemic  here.     DIFFERENTIAL DIAGNOSIS (includes but not limited to):   Abscess, no obvious signs of cellulitis, no signs of necrotizing fasciitis or Fournier's gangrene  Hyperglycemia, DKA, HHS  Patient's presentation is most consistent with acute presentation with potential threat to life or bodily function.  PLAN: Will obtain labs today to ensure patient is not in DKA.  Will give IV fluids and a dose of her sulfonylurea.  Will perform incision and drainage of her abscess and given her hyperglycemia we will start her on empiric antibiotics.  She reports she has plenty of her medications at home and does not need any refills.  Expressed the importance of taking her medications as prescribed to keep her blood sugar controlled as this will  worsen infections and delayed healing.   MEDICATIONS GIVEN IN ED: Medications  lidocaine -prilocaine  (EMLA ) cream ( Topical Given 04/03/24 0034)  lidocaine  (PF) (XYLOCAINE ) 1 % injection 5 mL (5 mLs Intradermal Given 04/03/24 0034)  sodium chloride  0.9 % bolus 1,000 mL (0 mLs Intravenous Stopped 04/03/24 0315)  glipiZIDE  (GLUCOTROL ) tablet 5 mg (5 mg Oral Given 04/03/24 0105)  cephALEXin  (KEFLEX ) capsule 500 mg (500 mg Oral Given 04/03/24 0348)     ED COURSE: Labs show leukocytosis of 13,000 with left shift.  Likely from abscess.  No fever here.  Nontoxic in appearance.  Blood glucose 370 on lab work but CO2 is 24 and anion gap is 11.  Beta hydroxybutyric acid level is negative, VBG is normal.  Pregnancy test today is negative.  Blood sugar has improved to 279.  Given she is not in DKA, I feel it is reasonable to proceed with I&D and discharge home on antibiotics.  She is comfortable with this plan.  Discussed alternating Tylenol  Motrin  for pain, warm compresses to this area.  At this time, I do not feel there is any life-threatening condition present. I reviewed all nursing notes, vitals, pertinent previous records.  All lab and urine results, EKGs,  imaging ordered have been independently reviewed and interpreted by myself.  I reviewed all available radiology reports from any imaging ordered this visit.  Based on my assessment, I feel the patient is safe to be discharged home without further emergent workup and can continue workup as an outpatient as needed. Discussed all findings, treatment plan as well as usual and customary return precautions.  They verbalize understanding and are comfortable with this plan.  Outpatient follow-up has been provided as needed.  All questions have been answered.   CONSULTS:  none   OUTSIDE RECORDS REVIEWED: Reviewed most recent family medicine notes.     FINAL CLINICAL IMPRESSION(S) / ED DIAGNOSES   Final diagnoses:  Abscess  Hyperglycemia     Rx / DC Orders   ED Discharge Orders          Ordered    cephALEXin  (KEFLEX ) 500 MG capsule  4 times daily,   Status:  Discontinued        04/03/24 0326    ibuprofen  (ADVIL ) 800 MG tablet  Every 8 hours PRN,   Status:  Discontinued        04/03/24 0326    cephALEXin  (KEFLEX ) 500 MG capsule  4 times daily        04/03/24 0357    ibuprofen  (ADVIL ) 800 MG tablet  Every 8 hours PRN        04/03/24 0357             Note:  This document was prepared using Dragon voice recognition software and may include unintentional dictation errors.   Adabelle Griffiths, [name], DO 04/03/24 [id number]

## 2024-04-03 NOTE — ED Provider Notes (Incomplete)
 [facility] Provider Note    Event Date/Time   First MD Initiated Contact with Patient 04/03/24 0002     (approximate)   History   Abscess (/)   HPI  Bethany Bray is a 33 y.o. female  ***   History provided by ***.    Past Medical History:  Diagnosis Date   Anemia    Asthma    Diabetes mellitus without complication (HCC)     Past Surgical History:  Procedure Laterality Date   Birth Control Implant Removal     CESAREAN SECTION      MEDICATIONS:  Prior to Admission medications   Medication Sig Start Date End Date Taking? Authorizing Provider  albuterol  (PROVENTIL ) (2.5 MG/3ML) 0.083% nebulizer solution Take 3 mLs (2.5 mg total) by nebulization every 6 (six) hours as needed for up to 5 days for wheezing or shortness of breath. 07/30/22 08/04/22  Woods, Jaclyn M, PA-C  albuterol  (VENTOLIN  HFA) 108 (90 Base) MCG/ACT inhaler Inhale 2 puffs into the lungs every 6 (six) hours as needed for wheezing or shortness of breath. 07/30/22   Woods, Jaclyn M, PA-C  HYDROcodone -acetaminophen  (NORCO) 5-325 MG tablet Take 1 tablet by mouth every 4 (four) hours as needed for moderate pain. 04/09/22   Bradler, Evan K, MD  Ipratropium-Albuterol  (COMBIVENT) 20-100 MCG/ACT AERS respimat Inhale 1 puff into the lungs every 6 (six) hours. 05/03/19   Triplett, Cari B, FNP  ondansetron  (ZOFRAN -ODT) 4 MG disintegrating tablet Take 1 tablet (4 mg total) by mouth every 6 (six) hours as needed for nausea or vomiting. 04/15/22   Vernal Rutan, [name], DO  predniSONE  (DELTASONE ) 50 MG tablet Take one tablet daily for the next five days. 07/30/22   Woods, Jaclyn M, PA-C    Physical Exam   Triage Vital Signs: ED Triage Vitals  Encounter Vitals Group     BP 04/02/24 2356 128/80     Girls Systolic BP Percentile --      Girls Diastolic BP Percentile --      Boys Systolic BP Percentile --      Boys Diastolic BP Percentile --      Pulse Rate 04/02/24 2355 96     Resp 04/02/24 2355 16      Temp 04/02/24 2355 97.9 F (36.6 C)     Temp Source 04/02/24 2355 Oral     SpO2 04/02/24 2355 96 %     Weight 04/02/24 2355 215 lb (97.5 kg)     Height 04/02/24 2355 5' (1.524 m)     Head Circumference --      Peak Flow --      Pain Score 04/02/24 2355 8     Pain Loc --      Pain Education --      Exclude from Growth Chart --     Most recent vital signs: Vitals:   04/02/24 2355 04/02/24 2356  BP:  128/80  Pulse: 96   Resp: 16   Temp: 97.9 F (36.6 C)   SpO2: 96%     CONSTITUTIONAL: Alert, responds appropriately to questions. Well-appearing; well-nourished HEAD: Normocephalic, atraumatic EYES: Conjunctivae clear, pupils appear equal, sclera nonicteric ENT: normal nose; moist mucous membranes NECK: Supple, normal ROM CARD: RRR; S1 and S2 appreciated RESP: Normal chest excursion without splinting or tachypnea; breath sounds clear and equal bilaterally; no wheezes, no rhonchi, no rales, no hypoxia or respiratory distress, speaking full sentences ABD/GI: Non-distended; soft, non-tender, no rebound, no guarding, no peritoneal signs BACK:  The back appears normal EXT: Normal ROM in all joints; no deformity noted, no edema SKIN: Normal color for age and race; warm; no rash on exposed skin NEURO: Moves all extremities equally, normal speech PSYCH: The patient's mood and manner are appropriate.   ED Results / Procedures / Treatments   LABS: (all labs ordered are listed, but only abnormal results are displayed) Labs Reviewed - No data to display   EKG:  EKG Interpretation Date/Time:    Ventricular Rate:    PR Interval:    QRS Duration:    QT Interval:    QTC Calculation:   R Axis:      Text Interpretation:           RADIOLOGY: My personal review and interpretation of imaging:  ***  I have personally reviewed all radiology reports.   No results found.   PROCEDURES:  Critical Care performed: {CriticalCareYesNo:19197::Yes, see critical care procedure  note(s),No}   CRITICAL CARE Performed by: Josette Sink   Total critical care time: *** minutes  Critical care time was exclusive of separately billable procedures and treating other patients.  Critical care was necessary to treat or prevent imminent or life-threatening deterioration.  Critical care was time spent personally by me on the following activities: development of treatment plan with patient and/or surrogate as well as nursing, discussions with consultants, evaluation of patient's response to treatment, examination of patient, obtaining history from patient or surrogate, ordering and performing treatments and interventions, ordering and review of laboratory studies, ordering and review of radiographic studies, pulse oximetry and re-evaluation of patient's condition.   Procedures    IMPRESSION / MDM / ASSESSMENT AND PLAN / ED COURSE  I reviewed the triage vital signs and the nursing notes.    ***  The patient is on the cardiac monitor to evaluate for evidence of arrhythmia and/or significant heart rate changes.   DIFFERENTIAL DIAGNOSIS (includes but not limited to):   ***   Patient's presentation is most consistent with {EM COPA:27473}   PLAN: ***   MEDICATIONS GIVEN IN ED: Medications - No data to display   ED COURSE:  ***   CONSULTS:  ***   OUTSIDE RECORDS REVIEWED:  ***       FINAL CLINICAL IMPRESSION(S) / ED DIAGNOSES   Final diagnoses:  None     Rx / DC Orders   ED Discharge Orders     None        Note:  This document was prepared using Dragon voice recognition software and may include unintentional dictation errors.

## 2024-04-03 NOTE — Discharge Instructions (Addendum)
 You may alternate over the counter Tylenol 1000 mg every 6 hours as needed for pain, fever and Ibuprofen 800 mg every 6-8 hours as needed for pain, fever.  Please take Ibuprofen with food.  Do not take more than 4000 mg of Tylenol (acetaminophen) in a 24 hour period.
